# Patient Record
Sex: Male | Born: 1965 | Hispanic: Yes | Marital: Married | Smoking: Never smoker
Health system: Southern US, Community
[De-identification: ages and names within clinical notes are randomized; demographics above are authoritative.]

## PROBLEM LIST (undated history)

## (undated) DIAGNOSIS — E785 Hyperlipidemia, unspecified: Secondary | ICD-10-CM

## (undated) DIAGNOSIS — I10 Essential (primary) hypertension: Secondary | ICD-10-CM

## (undated) HISTORY — DX: Hyperlipidemia, unspecified: E78.5

## (undated) HISTORY — PX: CATARACT EXTRACTION: SUR2

## (undated) HISTORY — DX: Essential (primary) hypertension: I10

---

## 2003-11-10 ENCOUNTER — Emergency Department: Payer: Self-pay | Admitting: Internal Medicine

## 2003-11-10 ENCOUNTER — Other Ambulatory Visit: Payer: Self-pay

## 2004-04-16 ENCOUNTER — Inpatient Hospital Stay: Payer: Self-pay | Admitting: Anesthesiology

## 2007-08-29 ENCOUNTER — Ambulatory Visit: Payer: Self-pay | Admitting: Family Medicine

## 2008-09-10 ENCOUNTER — Emergency Department: Payer: Self-pay | Admitting: Emergency Medicine

## 2010-11-04 ENCOUNTER — Emergency Department: Payer: Self-pay | Admitting: Emergency Medicine

## 2011-05-18 DIAGNOSIS — J351 Hypertrophy of tonsils: Secondary | ICD-10-CM | POA: Insufficient documentation

## 2011-05-18 DIAGNOSIS — J342 Deviated nasal septum: Secondary | ICD-10-CM | POA: Insufficient documentation

## 2011-05-18 DIAGNOSIS — J399 Disease of upper respiratory tract, unspecified: Secondary | ICD-10-CM | POA: Insufficient documentation

## 2011-05-18 DIAGNOSIS — G4733 Obstructive sleep apnea (adult) (pediatric): Secondary | ICD-10-CM | POA: Insufficient documentation

## 2011-06-17 DIAGNOSIS — J301 Allergic rhinitis due to pollen: Secondary | ICD-10-CM | POA: Insufficient documentation

## 2011-11-02 DIAGNOSIS — K603 Anal fistula: Secondary | ICD-10-CM | POA: Insufficient documentation

## 2012-09-28 ENCOUNTER — Emergency Department: Payer: Self-pay | Admitting: Unknown Physician Specialty

## 2012-09-28 LAB — COMPREHENSIVE METABOLIC PANEL
Albumin: 4.4 g/dL (ref 3.4–5.0)
Calcium, Total: 9 mg/dL (ref 8.5–10.1)
Chloride: 107 mmol/L (ref 98–107)
Co2: 24 mmol/L (ref 21–32)
Creatinine: 0.77 mg/dL (ref 0.60–1.30)
EGFR (Non-African Amer.): >60
Glucose: 92 mg/dL (ref 65–99)
Osmolality: 272 (ref 275–301)
Potassium: 3.3 mmol/L — ABNORMAL LOW (ref 3.5–5.1)
SGOT(AST): 25 U/L (ref 15–37)

## 2012-09-28 LAB — CBC
HGB: 15.5 g/dL (ref 13.0–18.0)
MCH: 31.7 pg (ref 26.0–34.0)
MCV: 90 fL (ref 80–100)
Platelet: 264 10*3/uL (ref 150–440)
RBC: 4.91 10*6/uL (ref 4.40–5.90)
RDW: 13.4 % (ref 11.5–14.5)
WBC: 10.6 10*3/uL (ref 3.8–10.6)

## 2012-09-28 LAB — LIPASE, BLOOD: Lipase: 288 U/L (ref 73–393)

## 2013-05-27 ENCOUNTER — Emergency Department: Payer: Self-pay | Admitting: Emergency Medicine

## 2013-05-27 LAB — COMPREHENSIVE METABOLIC PANEL
ANION GAP: 5 — AB (ref 7–16)
Albumin: 4.1 g/dL (ref 3.4–5.0)
Alkaline Phosphatase: 126 U/L — ABNORMAL HIGH
BILIRUBIN TOTAL: 0.3 mg/dL (ref 0.2–1.0)
BUN: 17 mg/dL (ref 7–18)
CREATININE: 0.7 mg/dL (ref 0.60–1.30)
Calcium, Total: 8.7 mg/dL (ref 8.5–10.1)
Chloride: 109 mmol/L — ABNORMAL HIGH (ref 98–107)
Co2: 27 mmol/L (ref 21–32)
EGFR (African American): >60
EGFR (Non-African Amer.): >60
Glucose: 134 mg/dL — ABNORMAL HIGH (ref 65–99)
OSMOLALITY: 285 (ref 275–301)
POTASSIUM: 4.1 mmol/L (ref 3.5–5.1)
SGOT(AST): 45 U/L — ABNORMAL HIGH (ref 15–37)
SGPT (ALT): 53 U/L (ref 12–78)
SODIUM: 141 mmol/L (ref 136–145)
Total Protein: 8.7 g/dL — ABNORMAL HIGH (ref 6.4–8.2)

## 2013-05-27 LAB — URINALYSIS, COMPLETE
Bacteria: NONE SEEN
Bilirubin,UR: NEGATIVE
GLUCOSE, UR: NEGATIVE mg/dL (ref 0–75)
Ketone: NEGATIVE
Leukocyte Esterase: NEGATIVE
Nitrite: NEGATIVE
Ph: 6 (ref 4.5–8.0)
Protein: NEGATIVE
SQUAMOUS EPITHELIAL: NONE SEEN
Specific Gravity: 1.008 (ref 1.003–1.030)
WBC UR: <1 /HPF (ref 0–5)

## 2013-05-27 LAB — CBC
HCT: 44.9 % (ref 40.0–52.0)
HGB: 15.5 g/dL (ref 13.0–18.0)
MCH: 32.2 pg (ref 26.0–34.0)
MCHC: 34.5 g/dL (ref 32.0–36.0)
MCV: 93 fL (ref 80–100)
Platelet: 254 10*3/uL (ref 150–440)
RBC: 4.82 10*6/uL (ref 4.40–5.90)
RDW: 13.2 % (ref 11.5–14.5)
WBC: 11.5 10*3/uL — ABNORMAL HIGH (ref 3.8–10.6)

## 2013-10-31 ENCOUNTER — Emergency Department: Payer: Self-pay | Admitting: Emergency Medicine

## 2013-10-31 LAB — CBC
HCT: 44.8 % (ref 40.0–52.0)
HGB: 14.7 g/dL (ref 13.0–18.0)
MCH: 30.4 pg (ref 26.0–34.0)
MCHC: 32.8 g/dL (ref 32.0–36.0)
MCV: 93 fL (ref 80–100)
PLATELETS: 279 10*3/uL (ref 150–440)
RBC: 4.84 10*6/uL (ref 4.40–5.90)
RDW: 13.4 % (ref 11.5–14.5)
WBC: 11.5 10*3/uL — ABNORMAL HIGH (ref 3.8–10.6)

## 2013-10-31 LAB — COMPREHENSIVE METABOLIC PANEL
ALBUMIN: 3.9 g/dL (ref 3.4–5.0)
ALT: 40 U/L
ANION GAP: 5 — AB (ref 7–16)
Alkaline Phosphatase: 100 U/L
BILIRUBIN TOTAL: 0.4 mg/dL (ref 0.2–1.0)
BUN: 9 mg/dL (ref 7–18)
Calcium, Total: 8.7 mg/dL (ref 8.5–10.1)
Chloride: 109 mmol/L — ABNORMAL HIGH (ref 98–107)
Co2: 27 mmol/L (ref 21–32)
Creatinine: 0.83 mg/dL (ref 0.60–1.30)
EGFR (African American): >60
EGFR (Non-African Amer.): >60
Glucose: 108 mg/dL — ABNORMAL HIGH (ref 65–99)
Osmolality: 280 (ref 275–301)
Potassium: 3.9 mmol/L (ref 3.5–5.1)
SGOT(AST): 30 U/L (ref 15–37)
Sodium: 141 mmol/L (ref 136–145)
Total Protein: 8.5 g/dL — ABNORMAL HIGH (ref 6.4–8.2)

## 2013-10-31 LAB — URINALYSIS, COMPLETE
Bacteria: NONE SEEN
Bilirubin,UR: NEGATIVE
GLUCOSE, UR: NEGATIVE mg/dL (ref 0–75)
KETONE: NEGATIVE
LEUKOCYTE ESTERASE: NEGATIVE
Nitrite: NEGATIVE
PROTEIN: NEGATIVE
Ph: 6 (ref 4.5–8.0)
SPECIFIC GRAVITY: 1.002 (ref 1.003–1.030)
Squamous Epithelial: NONE SEEN
WBC UR: NONE SEEN /HPF (ref 0–5)

## 2013-10-31 LAB — TROPONIN I: Troponin-I: <0.02 ng/mL

## 2013-10-31 LAB — LIPASE, BLOOD: Lipase: 148 U/L (ref 73–393)

## 2013-11-04 ENCOUNTER — Emergency Department: Payer: Self-pay | Admitting: Emergency Medicine

## 2013-11-04 LAB — COMPREHENSIVE METABOLIC PANEL
ALK PHOS: 115 U/L
ALT: 52 U/L
AST: 32 U/L (ref 15–37)
Albumin: 4.1 g/dL (ref 3.4–5.0)
Anion Gap: 7 (ref 7–16)
BILIRUBIN TOTAL: 0.3 mg/dL (ref 0.2–1.0)
BUN: 12 mg/dL (ref 7–18)
CO2: 29 mmol/L (ref 21–32)
Calcium, Total: 9.1 mg/dL (ref 8.5–10.1)
Chloride: 104 mmol/L (ref 98–107)
Creatinine: 0.83 mg/dL (ref 0.60–1.30)
Glucose: 103 mg/dL — ABNORMAL HIGH (ref 65–99)
Osmolality: 279 (ref 275–301)
POTASSIUM: 4.5 mmol/L (ref 3.5–5.1)
SODIUM: 140 mmol/L (ref 136–145)
Total Protein: 9 g/dL — ABNORMAL HIGH (ref 6.4–8.2)

## 2013-11-04 LAB — CBC WITH DIFFERENTIAL/PLATELET
BASOS ABS: 0.1 10*3/uL (ref 0.0–0.1)
Basophil %: 0.5 %
EOS ABS: 0.4 10*3/uL (ref 0.0–0.7)
Eosinophil %: 3.4 %
HCT: 46.5 % (ref 40.0–52.0)
HGB: 15.4 g/dL (ref 13.0–18.0)
LYMPHS PCT: 33.7 %
Lymphocyte #: 4 10*3/uL — ABNORMAL HIGH (ref 1.0–3.6)
MCH: 30.7 pg (ref 26.0–34.0)
MCHC: 33.1 g/dL (ref 32.0–36.0)
MCV: 93 fL (ref 80–100)
MONO ABS: 1.1 x10 3/mm — AB (ref 0.2–1.0)
Monocyte %: 9 %
NEUTROS PCT: 53.4 %
Neutrophil #: 6.3 10*3/uL (ref 1.4–6.5)
Platelet: 299 10*3/uL (ref 150–440)
RBC: 5.01 10*6/uL (ref 4.40–5.90)
RDW: 13.2 % (ref 11.5–14.5)
WBC: 11.8 10*3/uL — ABNORMAL HIGH (ref 3.8–10.6)

## 2013-11-04 LAB — TROPONIN I: Troponin-I: <0.02 ng/mL

## 2013-11-04 LAB — LIPASE, BLOOD: LIPASE: 156 U/L (ref 73–393)

## 2013-11-06 DIAGNOSIS — G4733 Obstructive sleep apnea (adult) (pediatric): Secondary | ICD-10-CM | POA: Insufficient documentation

## 2014-11-23 ENCOUNTER — Emergency Department: Payer: Self-pay

## 2014-11-23 ENCOUNTER — Emergency Department
Admission: EM | Admit: 2014-11-23 | Discharge: 2014-11-24 | Disposition: A | Discharge status: Home / Self Care | Payer: Self-pay | Attending: Emergency Medicine | Admitting: Emergency Medicine

## 2014-11-23 DIAGNOSIS — R51 Headache: Secondary | ICD-10-CM | POA: Insufficient documentation

## 2014-11-23 DIAGNOSIS — R110 Nausea: Secondary | ICD-10-CM | POA: Insufficient documentation

## 2014-11-23 DIAGNOSIS — R519 Headache, unspecified: Secondary | ICD-10-CM

## 2014-11-23 MED ORDER — PROCHLORPERAZINE EDISYLATE 5 MG/ML IJ SOLN
10.0000 mg | Freq: Once | INTRAMUSCULAR | Status: AC
  Administered 2014-11-23: 10 mg via INTRAVENOUS
  Filled 2014-11-23: qty 2

## 2014-11-23 MED ORDER — SODIUM CHLORIDE 0.9 % IV BOLUS (SEPSIS)
1000.0000 mL | Freq: Once | INTRAVENOUS | Status: AC
  Administered 2014-11-23: 1000 mL via INTRAVENOUS

## 2014-11-23 MED ORDER — METOPROLOL TARTRATE 50 MG PO TABS
ORAL_TABLET | ORAL | Status: AC
  Administered 2014-11-23: 50 mg via ORAL
  Filled 2014-11-23: qty 1

## 2014-11-23 MED ORDER — METOPROLOL TARTRATE 50 MG PO TABS
50.0000 mg | ORAL_TABLET | Freq: Once | ORAL | Status: AC
  Administered 2014-11-23: 50 mg via ORAL

## 2014-11-23 NOTE — ED Provider Notes (Signed)
[facility] Emergency Department Provider Note  ____________________________________________  Time seen: Approximately 9:25 PM  I have reviewed the triage vital signs and the nursing notes.   HISTORY  Chief Complaint Headache  Spanish interpreter used for interpretation.  HPI Thomas Gaines is a 49 y.o. male with a history of sleep apnea, hyperlipidemia and high blood pressure was presenting today with 1 week of headache. He said it started in the posterior of his head and is now progressed to the entire head. He describes it as unbearable. He says that the pain has been worse in the mornings and then typically improves throughout the day. He denies any known cause for his headaches. Denies any recent stresses. Says he has had some difficulty going to sleep at night because he thinks his CPAP is blowing oxygen too hard. However, has been sleeping well otherwise. Denies any increased caffeine intake. Does not a history of headaches. Denies any vision changes, dizziness. Has some mild nausea. Does not report any neck pain. Denies any focal weakness or numbness. Was born in [place] but immig[place] to [place] in [place].   No past medical history on file.  There are no active problems to display for this patient.   No past surgical history on file.  No current outpatient prescriptions on file.  Allergies Review of patient's allergies indicates no known allergies.  No family history on file.  Social History Social History  Substance Use Topics  . Smoking status: Not on file  . Smokeless tobacco: Not on file  . Alcohol Use: Not on file    Review of Systems Constitutional: No fever/chills Eyes: No visual changes. ENT: No sore throat. Cardiovascular: Denies chest pain. Respiratory: Denies shortness of breath. Gastrointestinal: No abdominal pain.  No nausea, no vomiting.  No diarrhea.  No constipation. Genitourinary: Negative for  dysuria. Musculoskeletal: Negative for back pain. Skin: Negative for rash. Neurological: Negative for focal weakness or numbness.  10-point ROS otherwise negative.  ____________________________________________   PHYSICAL EXAM:  VITAL SIGNS: ED Triage Vitals  Enc Vitals Group     BP 11/23/14 2049 164/91 mmHg     Pulse Rate 11/23/14 2049 106     Resp 11/23/14 2049 18     Temp 11/23/14 2049 98.7 F (37.1 C)     Temp Source 11/23/14 2049 Oral     SpO2 11/23/14 2049 100 %     Weight 11/23/14 2049 200 lb (90.719 kg)     Height 11/23/14 2049 5\' 9"  (1.753 m)     Head Cir --      Peak Flow --      Pain Score 11/23/14 2050 8     Pain Loc --      Pain Edu? --      Excl. in GC? --     Constitutional: Alert and oriented. Well appearing and in no acute distress. Eyes: Conjunctivae are normal. PERRL. EOMI. Head: Atraumatic. No tenderness palpation along the distribution of the temporal arteries. Nose: No congestion/rhinnorhea. Mouth/Throat: Mucous membranes are moist.  Oropharynx non-erythematous. Neck: No stridor.   Cardiovascular: Normal rate, regular rhythm. Grossly normal heart sounds.  Good peripheral circulation. Respiratory: Normal respiratory effort.  No retractions. Lungs CTAB. Gastrointestinal: Soft and nontender. No distention. No abdominal bruits. No CVA tenderness. Musculoskeletal: No lower extremity tenderness nor edema.  No joint effusions. Neurologic:  Normal speech and language. No gross focal neurologic deficits are appreciated. No gait instability. Skin:  Skin is warm, dry and intact.  No rash noted. Psychiatric: Mood and affect are normal. Speech and behavior are normal.  ____________________________________________   LABS (all labs ordered are listed, but only abnormal results are displayed)  Labs Reviewed - No data to display ____________________________________________  EKG   ____________________________________________  RADIOLOGY  CAT scan of the  brain without any acute pathology. ____________________________________________   PROCEDURES    ____________________________________________   INITIAL IMPRESSION / ASSESSMENT AND PLAN / ED COURSE  Pertinent labs & imaging results that were available during my care of the patient were reviewed by me and considered in my medical decision making (see chart for details).  ----------------------------------------- 12:33 AM on 11/24/2014 -----------------------------------------  Patient now is 0 out of 10 pain. Tachycardic but given evening dose of beta blocker now heart rate is 92 bpm. Patient with migrainous and tension type qualities to the headache. We'll discharge with prescription for fears at. Patient to follow up with primary care doctor. Patient understands plan and is willing to comply. History and physical not consistent with stroke or subarachnoid hemorrhage. The headache was slow onset over the past week. Also without any neurologic deficits. ____________________________________________   FINAL CLINICAL IMPRESSION(S) / ED DIAGNOSES  Headache, resolved.    Myrna Blazer, MD 11/24/14 [phone]

## 2014-11-23 NOTE — ED Notes (Signed)
MD at bedside. 

## 2014-11-23 NOTE — ED Notes (Signed)
Headache for a week, has gotten worse over the past couple days and now unable to tolerate due to the pain, most of pain in the posterior portion of pt's head. Some nausea. Pt states no headache like this in the past. Pt denies blurred vision of dizziness, denies weakness, no cough cold or fever

## 2014-11-24 MED ORDER — BUTALBITAL-APAP-CAFFEINE 50-325-40 MG PO TABS: 1.0000 | ORAL_TABLET | Freq: Four times a day (QID) | ORAL | Status: AC | PRN

## 2014-11-24 MED ORDER — SODIUM CHLORIDE 0.9 % IV BOLUS (SEPSIS)
1000.0000 mL | Freq: Once | INTRAVENOUS | Status: DC
Start: 2014-11-24 — End: 2014-11-24

## 2014-11-24 NOTE — Discharge Instructions (Signed)
Dolor de cabeza general sin causa °(General Headache Without Cause) °El dolor de cabeza es un dolor o malestar que se siente en la zona de la cabeza o del cuello. Puede no tener una causa específica. Hay muchas causas y tipos de dolores de cabeza. Los dolores de cabeza más comunes son los siguientes: °· Cefalea tensional. °· Cefaleas migrañosas. °· Cefalea en brotes. °· Cefaleas diarias crónicas. °INSTRUCCIONES PARA EL CUIDADO EN EL HOGAR  °Controle su afección para ver si hay cambios. Siga estos pasos para controlar la afección: °Control del dolor °· Tome los medicamentos de venta libre y los recetados solamente como se lo haya indicado el médico. °· Cuando sienta dolor de cabeza acuéstese en un cuarto oscuro y tranquilo. °· Si se lo indican, aplique hielo sobre la cabeza y la zona del cuello: °¨ Ponga el hielo en una bolsa plástica. °¨ Coloque una toalla entre la piel y la bolsa de hielo. °¨ Coloque el hielo durante 20 minutos, 2 a 3 veces por día. °· Utilice una almohadilla térmica o tome una ducha con agua caliente para aplicar calor en la cabeza y la zona del cuello como se lo haya indicado el médico. °· Mantenga las luces tenues si le molesta las luces brillantes o sus dolores de cabeza empeoran. °Comida y bebida °· Mantenga un horario para las comidas. °· Limite el consumo de bebidas alcohólicas. °· Consuma menos cantidad de cafeína o deje de tomarla. °Instrucciones generales °· Concurra a todas las visitas de control como se lo haya indicado el médico. Esto es importante. °· Lleve un diario de los dolores de cabeza para averiguar qué factores pueden desencadenarlos. Por ejemplo, escriba los siguientes datos: °¨ Lo que usted come y bebe. °¨ Cuánto tiempo duerme. °¨ Algún cambio en su dieta o en los medicamentos. °· Pruebe algunas técnicas de relajación, como los masajes. °· Limite el estrés. °· Siéntese con la espalda recta y no tense los músculos. °· No consuma productos que contengan tabaco, incluidos  cigarrillos, tabaco de mascar o cigarrillos electrónicos. Si necesita ayuda para dejar de fumar, consulte al médico. °· Haga actividad física habitualmente como se lo haya indicado el médico. °· Tenga un horario fijo para dormir. Duerma entre 7 y 9 horas o la cantidad de horas que le haya recomendado el médico. °SOLICITE ATENCIÓN MÉDICA SI:  °· Los medicamentos no logran aliviar los síntomas. °· Tiene un dolor de cabeza que es diferente del dolor de cabeza habitual. °· Tiene náuseas o vómitos. °· Tiene fiebre. °SOLICITE ATENCIÓN MÉDICA DE INMEDIATO SI:  °· El dolor se hace cada vez más intenso. °· Ha vomitado repetidas veces. °· Presenta rigidez en el cuello. °· Sufre pérdida de la visión. °· Tiene problemas para hablar. °· Siente dolor en el ojo o en el oído. °· Presenta debilidad muscular o pérdida del control muscular. °· Pierde el equilibrio o tiene problemas para caminar. °· Sufre mareos o se desmaya. °· Se siente confundido. °  °Esta información no tiene como fin reemplazar el consejo del médico. Asegúrese de hacerle al médico cualquier pregunta que tenga. °  °Document Released: 11/03/2004 Document Revised: 10/15/2014 °Elsevier Interactive Patient Education ©2016 Elsevier Inc. ° °

## 2015-04-01 DIAGNOSIS — Z9989 Dependence on other enabling machines and devices: Secondary | ICD-10-CM

## 2015-04-01 DIAGNOSIS — G479 Sleep disorder, unspecified: Secondary | ICD-10-CM | POA: Insufficient documentation

## 2015-04-01 DIAGNOSIS — G2581 Restless legs syndrome: Secondary | ICD-10-CM | POA: Insufficient documentation

## 2015-04-01 DIAGNOSIS — G4733 Obstructive sleep apnea (adult) (pediatric): Secondary | ICD-10-CM | POA: Insufficient documentation

## 2015-10-22 ENCOUNTER — Ambulatory Visit: Payer: BLUE CROSS/BLUE SHIELD | Attending: Neurology

## 2015-10-22 DIAGNOSIS — G4733 Obstructive sleep apnea (adult) (pediatric): Secondary | ICD-10-CM | POA: Insufficient documentation

## 2015-10-23 DIAGNOSIS — I10 Essential (primary) hypertension: Secondary | ICD-10-CM | POA: Insufficient documentation

## 2015-10-23 DIAGNOSIS — R0602 Shortness of breath: Secondary | ICD-10-CM | POA: Insufficient documentation

## 2015-10-23 DIAGNOSIS — I4510 Unspecified right bundle-branch block: Secondary | ICD-10-CM | POA: Insufficient documentation

## 2015-10-23 DIAGNOSIS — R002 Palpitations: Secondary | ICD-10-CM | POA: Insufficient documentation

## 2016-01-22 ENCOUNTER — Encounter: Payer: Self-pay | Admitting: Urology

## 2016-01-22 ENCOUNTER — Ambulatory Visit: Payer: BLUE CROSS/BLUE SHIELD | Admitting: Urology

## 2016-01-22 VITALS — BP 110/71 | HR 65 | Ht 69.0 in | Wt 193.6 lb

## 2016-01-22 DIAGNOSIS — F329 Major depressive disorder, single episode, unspecified: Secondary | ICD-10-CM | POA: Insufficient documentation

## 2016-01-22 DIAGNOSIS — F32A Depression, unspecified: Secondary | ICD-10-CM | POA: Insufficient documentation

## 2016-01-22 DIAGNOSIS — K219 Gastro-esophageal reflux disease without esophagitis: Secondary | ICD-10-CM | POA: Insufficient documentation

## 2016-01-22 DIAGNOSIS — F419 Anxiety disorder, unspecified: Secondary | ICD-10-CM | POA: Insufficient documentation

## 2016-01-22 DIAGNOSIS — J309 Allergic rhinitis, unspecified: Secondary | ICD-10-CM | POA: Insufficient documentation

## 2016-01-22 DIAGNOSIS — N50812 Left testicular pain: Secondary | ICD-10-CM | POA: Diagnosis not present

## 2016-01-22 DIAGNOSIS — I10 Essential (primary) hypertension: Secondary | ICD-10-CM | POA: Insufficient documentation

## 2016-01-22 DIAGNOSIS — J351 Hypertrophy of tonsils: Secondary | ICD-10-CM | POA: Insufficient documentation

## 2016-01-22 DIAGNOSIS — E785 Hyperlipidemia, unspecified: Secondary | ICD-10-CM | POA: Insufficient documentation

## 2016-01-22 LAB — URINALYSIS, COMPLETE
Bilirubin, UA: NEGATIVE
GLUCOSE, UA: NEGATIVE
KETONES UA: NEGATIVE
Leukocytes, UA: NEGATIVE
NITRITE UA: NEGATIVE
Protein, UA: NEGATIVE
RBC, UA: NEGATIVE
SPEC GRAV UA: 1.015 (ref 1.005–1.030)
UUROB: 0.2 mg/dL (ref 0.2–1.0)
pH, UA: 5.5 (ref 5.0–7.5)

## 2016-01-22 LAB — MICROSCOPIC EXAMINATION: Bacteria, UA: NONE SEEN

## 2016-01-22 NOTE — Progress Notes (Signed)
01/22/2016 10:55 AM   Peterson LombardSergio Dirzo Hernandez 31-May-1965 [id number]  Referring provider: Phineas Realharles Drew [facility] [address] [place], [place] [address]  Chief Complaint  Patient presents with  . New Patient (Initial Visit)    Left testicle pain    HPI: The patient is a 50 year old gentleman who presents today for evaluation of left testicular pain. He has been treated with Bactrim with no relief. The pain is mainly located in his left testicle but also radiates to his penis.  His main concern though is the pain in his left testicle. It is a dull squeezing pain. Has been present for 2 weeks. Do not have this pain prior. He has mild dysuria with urination as well. He has been on Bactrim which has not helped this persisted. His urine cultures as well as STD screening were negative. He has never had this pain before. He denies trauma to his groin. He has no issues of urinary tract infections. His urinalysis today is unremarkable.   PMH: Past Medical History:  Diagnosis Date  . Hyperlipidemia   . Hypertension     Surgical History: Past Surgical History:  Procedure Laterality Date  . CATARACT EXTRACTION      Home Medications:  Allergies as of 01/22/2016   No Known Allergies     Medication List       Accurate as of 01/22/16 10:55 AM. Always use your most recent med list.          gabapentin 300 MG capsule Commonly known as:  NEURONTIN Take by mouth.   lisinopril 10 MG tablet Commonly known as:  PRINIVIL,ZESTRIL Take by mouth.   metoprolol succinate 100 MG 24 hr tablet Commonly known as:  TOPROL-XL Take by mouth.   simvastatin 20 MG tablet Commonly known as:  ZOCOR Take by mouth.   sulfamethoxazole-trimethoprim 800-160 MG tablet Commonly known as:  BACTRIM DS,SEPTRA DS Take 1 tablet by mouth 2 (two) times daily.       Allergies: No Known Allergies  Family History: Family History  Problem Relation Age of Onset  . Prostate  cancer Neg Hx   . Bladder Cancer Neg Hx   . Kidney cancer Neg Hx     Social History:  reports that he has never smoked. He has never used smokeless tobacco. He reports that he uses drugs, including Anabolic steroids. He reports that he does not drink alcohol.  ROS: UROLOGY Frequent Urination?: Yes Hard to postpone urination?: Yes Burning/pain with urination?: Yes Get up at night to urinate?: No Leakage of urine?: No Urine stream starts and stops?: No Trouble starting stream?: Yes Do you have to strain to urinate?: Yes Blood in urine?: Yes Urinary tract infection?: No Sexually transmitted disease?: No Injury to kidneys or bladder?: No Painful intercourse?: No Weak stream?: No Erection problems?: No Penile pain?: Yes  Gastrointestinal Nausea?: No Vomiting?: No Indigestion/heartburn?: No Diarrhea?: Yes Constipation?: No  Constitutional Fever: No Night sweats?: No Weight loss?: No Fatigue?: No  Skin Skin rash/lesions?: No Itching?: No  Eyes Blurred vision?: No Double vision?: No  Ears/Nose/Throat Sore throat?: No Sinus problems?: No  Hematologic/Lymphatic Swollen glands?: No Easy bruising?: No  Cardiovascular Leg swelling?: No Chest pain?: Yes  Respiratory Cough?: No Shortness of breath?: Yes  Endocrine Excessive thirst?: No  Musculoskeletal Back pain?: Yes Joint pain?: Yes  Neurological Headaches?: No Dizziness?: No  Psychologic Depression?: No Anxiety?: No  Physical Exam: BP 110/71 (BP Location: Left Arm, Patient Position: Sitting, Cuff Size: Large)  Pulse 65   Ht 5\' 9"  (1.753 m)   Wt 193 lb 9.6 oz (87.8 kg)   BMI 28.59 kg/m   Constitutional:  Alert and oriented, No acute distress. HEENT:  AT, moist mucus membranes.  Trachea midline, no masses. Cardiovascular: No clubbing, cyanosis, or edema. Respiratory: Normal respiratory effort, no increased work of breathing. GI: Abdomen is soft, nontender, nondistended, no abdominal  masses GU: No CVA tenderness. Normal uncircumcised phallus. Bilateral testicles descended equally bilaterally. Nontender to palpation. No masses. Benign. DRE: 1+ benign. No evidence of prostatitis. Skin: No rashes, bruises or suspicious lesions. Lymph: No cervical or inguinal adenopathy. Neurologic: Grossly intact, no focal deficits, moving all 4 extremities. Psychiatric: Normal mood and affect.  Laboratory Data: Lab Results  Component Value Date   WBC 11.8 (H) 11/04/2013   HGB 15.4 11/04/2013   HCT 46.5 11/04/2013   MCV 93 11/04/2013   PLT 299 11/04/2013    Lab Results  Component Value Date   CREATININE 0.83 11/04/2013    No results found for: PSA  No results found for: TESTOSTERONE  No results found for: HGBA1C  Urinalysis    Component Value Date/Time   COLORURINE Colorless 10/31/2013 0412   APPEARANCEUR Clear 10/31/2013 0412   LABSPEC 1.002 10/31/2013 0412   PHURINE 6.0 10/31/2013 0412   GLUCOSEU Negative 10/31/2013 0412   HGBUR 1+ 10/31/2013 0412   BILIRUBINUR Negative 10/31/2013 0412   KETONESUR Negative 10/31/2013 0412   PROTEINUR Negative 10/31/2013 0412   NITRITE Negative 10/31/2013 0412   LEUKOCYTESUR Negative 10/31/2013 0412     Assessment & Plan:    1. Left testicular pain I discussed with the patient that he has no apparent infectious etiology for his left testicular pain. It is most likely musculoskeletal in nature. I recommended that he take ibuprofen 600 mg 3 times a day and has scheduled basis. He is also instructed to ice his scrotum 2 times per day. He is instructed of this pain may take some time to go away. He'll follow-up in a month if he is still symptomatic.   Return in about 4 weeks (around 02/19/2016).  Hildred LaserBrian James Jerian Morais, MD  [facility] [address], [address] [place], [place] [address] [phone]

## 2016-02-25 ENCOUNTER — Ambulatory Visit: Payer: BLUE CROSS/BLUE SHIELD

## 2016-03-10 ENCOUNTER — Ambulatory Visit: Payer: BLUE CROSS/BLUE SHIELD | Admitting: Urology

## 2016-03-10 ENCOUNTER — Encounter: Payer: Self-pay | Admitting: Urology

## 2016-03-10 VITALS — BP 145/91 | HR 70 | Ht 69.0 in | Wt 192.9 lb

## 2016-03-10 DIAGNOSIS — N50819 Testicular pain, unspecified: Secondary | ICD-10-CM | POA: Diagnosis not present

## 2016-03-10 NOTE — Progress Notes (Signed)
03/10/2016 2:07 PM   Thomas Gaines 05-16-1965 [id number]  Referring provider: Hyman HopesHarriett P Burns, MD [address] [place], [place] [address]  Chief Complaint  Patient presents with  . Testicle Pain    HPI: The patient is a 51 year old gentleman who presents today for evaluation of left testicular pain. He has been treated with Bactrim with no relief. The pain is mainly located in his left testicle but also radiates to his penis.  His main concern though is the pain in his left testicle. It is a dull squeezing pain. Has been present for 2 weeks. Do not have this pain prior. He has mild dysuria with urination as well. He has been on Bactrim which has not helped this persisted. His urine cultures as well as STD screening were negative. He has never had this pain before. He denies trauma to his groin. He has no issues of urinary tract infections. His urinalysis today is unremarkable. Exam was benign at first visit.  At his last visit he was started on ibuprofen and icing of his scrotum.  He did not do this that often. He feels that he has a mass in his left testicle. When he touches that he has pain for 4-5 days. He is concerned by this.    PMH: Past Medical History:  Diagnosis Date  . Hyperlipidemia   . Hyperlipidemia   . Hypertension     Surgical History: Past Surgical History:  Procedure Laterality Date  . CATARACT EXTRACTION      Home Medications:  Allergies as of 03/10/2016   No Known Allergies     Medication List       Accurate as of 03/10/16  2:07 PM. Always use your most recent med list.          gabapentin 300 MG capsule Commonly known as:  NEURONTIN Take by mouth.   lisinopril 10 MG tablet Commonly known as:  PRINIVIL,ZESTRIL Take by mouth.   metoprolol succinate 100 MG 24 hr tablet Commonly known as:  TOPROL-XL Take by mouth.   simvastatin 20 MG tablet Commonly known as:  ZOCOR Take by mouth.   sulfamethoxazole-trimethoprim 800-160 MG  tablet Commonly known as:  BACTRIM DS,SEPTRA DS Take 1 tablet by mouth 2 (two) times daily.       Allergies: No Known Allergies  Family History: Family History  Problem Relation Age of Onset  . Prostate cancer Neg Hx   . Bladder Cancer Neg Hx   . Kidney cancer Neg Hx     Social History:  reports that he has never smoked. He has never used smokeless tobacco. He reports that he uses drugs, including Anabolic steroids. He reports that he does not drink alcohol.  ROS: UROLOGY Frequent Urination?: Yes Hard to postpone urination?: No Burning/pain with urination?: No Get up at night to urinate?: No Leakage of urine?: No Urine stream starts and stops?: No Trouble starting stream?: Yes Do you have to strain to urinate?: Yes Blood in urine?: No Urinary tract infection?: Yes Sexually transmitted disease?: No Injury to kidneys or bladder?: No Painful intercourse?: No Weak stream?: Yes Erection problems?: No Penile pain?: No  Gastrointestinal Nausea?: No Vomiting?: No Indigestion/heartburn?: No Diarrhea?: Yes Constipation?: No  Constitutional Fever: No Night sweats?: No Weight loss?: No Fatigue?: No  Skin Skin rash/lesions?: Yes Itching?: No  Eyes Blurred vision?: No Double vision?: No  Ears/Nose/Throat Sore throat?: No Sinus problems?: No  Hematologic/Lymphatic Swollen glands?: No Easy bruising?: No  Cardiovascular Leg swelling?:  No Chest pain?: No  Respiratory Cough?: No Shortness of breath?: No  Endocrine Excessive thirst?: No  Musculoskeletal Back pain?: No Joint pain?: No  Neurological Headaches?: No Dizziness?: No  Psychologic Depression?: No Anxiety?: No  Physical Exam: BP (!) 145/91 (BP Location: Left Arm, Patient Position: Sitting, Cuff Size: Normal)   Pulse 70   Ht 5\' 9"  (1.753 m)   Wt 192 lb 14.4 oz (87.5 kg)   BMI 28.49 kg/m   Constitutional:  Alert and oriented, No acute distress. HEENT: [place] AT, moist mucus membranes.   Trachea midline, no masses. Cardiovascular: No clubbing, cyanosis, or edema. Respiratory: Normal respiratory effort, no increased work of breathing. GI: Abdomen is soft, nontender, nondistended, no abdominal masses GU: No CVA tenderness. His left testicular exam again is normal to me that he has a fairly tight scrotum so it is somewhat limited. The patient attempted to find a mass that he felt prior however he was unable to do that during this exam. Skin: No rashes, bruises or suspicious lesions. Lymph: No cervical or inguinal adenopathy. Neurologic: Grossly intact, no focal deficits, moving all 4 extremities. Psychiatric: Normal mood and affect.  Laboratory Data: Lab Results  Component Value Date   WBC 11.8 (H) 11/04/2013   HGB 15.4 11/04/2013   HCT 46.5 11/04/2013   MCV 93 11/04/2013   PLT 299 11/04/2013    Lab Results  Component Value Date   CREATININE 0.83 11/04/2013    No results found for: PSA  No results found for: TESTOSTERONE  No results found for: HGBA1C  Urinalysis    Component Value Date/Time   COLORURINE Colorless 10/31/2013 0412   APPEARANCEUR Clear 01/22/2016 1005   LABSPEC 1.002 10/31/2013 0412   PHURINE 6.0 10/31/2013 0412   GLUCOSEU Negative 01/22/2016 1005   GLUCOSEU Negative 10/31/2013 0412   HGBUR 1+ 10/31/2013 0412   BILIRUBINUR Negative 01/22/2016 1005   BILIRUBINUR Negative 10/31/2013 0412   KETONESUR Negative 10/31/2013 0412   PROTEINUR Negative 01/22/2016 1005   PROTEINUR Negative 10/31/2013 0412   NITRITE Negative 01/22/2016 1005   NITRITE Negative 10/31/2013 0412   LEUKOCYTESUR Negative 01/22/2016 1005   LEUKOCYTESUR Negative 10/31/2013 0412     Assessment & Plan:    1. Left testicular pain As the patient feels that there is a mass that I'm unable to palpate his left hemiscrotum, I will have him undergo a scrotal ultrasound. He'll follow-up after this for the results. He was instructed to use ibuprofen and I seem the scrotum in the  meantime for his testicular pain.  Return for after scrotal u/s.  Hildred Laser, MD  [facility] [address], [address] [place], [place] [phone]

## 2016-03-18 ENCOUNTER — Ambulatory Visit
Admission: RE | Admit: 2016-03-18 | Discharge: 2016-03-18 | Disposition: A | Discharge status: Home / Self Care | Payer: BLUE CROSS/BLUE SHIELD | Source: Ambulatory Visit | Attending: Urology | Admitting: Urology

## 2016-03-18 DIAGNOSIS — N50819 Testicular pain, unspecified: Secondary | ICD-10-CM | POA: Diagnosis not present

## 2016-03-23 ENCOUNTER — Other Ambulatory Visit: Payer: Self-pay

## 2016-04-07 ENCOUNTER — Encounter: Payer: Self-pay | Admitting: Urology

## 2016-04-07 ENCOUNTER — Ambulatory Visit: Payer: BLUE CROSS/BLUE SHIELD | Admitting: Urology

## 2016-04-07 VITALS — BP 146/76 | HR 91 | Ht 68.0 in | Wt 192.1 lb

## 2016-04-07 DIAGNOSIS — N50819 Testicular pain, unspecified: Secondary | ICD-10-CM | POA: Diagnosis not present

## 2016-04-07 NOTE — Progress Notes (Signed)
04/07/2016 10:53 AM   Thomas Gaines [birth year] [id number]  Referring provider: Hyman Hopes, MD [address] [place], [place] [address]  Chief Complaint  Patient presents with  . Follow-up    scrotal US    HPI: The patient is a 51 year old gentleman who presents today for evaluation of left testicular pain. He has been treated with Bactrim with no relief. The pain is mainly located in his left testicle but also radiates to his penis. His main concern though is the pain in his left testicle. It is a dull squeezing pain. Has been present for 2 weeks. Do not have this pain prior. He has mild dysuria with urination as well. He has been on Bactrim which has not helped this persisted. His urine cultures as well as STD screening were negative. He has never had this pain before. He denies trauma to his groin. He has no issues of urinary tract infections. His urinalysis today is unremarkable. Exam was benign at first visit.  At his last visit he was started on ibuprofen and icing of his scrotum.  He did not do this that often. He feels that he has a mass in his left testicle. When he touches that he has pain for 4-5 days. He is concerned by this but not able to reproduce or locate on repeat exam.    He returns today to review scrotal ultrasound results which were unremarkable with no masses. He does again note that his strenuous job does seem to exacerbate this pain. Advil has not helped with his discomfort.   PMH: Past Medical History:  Diagnosis Date  . Hyperlipidemia   . Hyperlipidemia   . Hypertension     Surgical History: Past Surgical History:  Procedure Laterality Date  . CATARACT EXTRACTION      Home Medications:  Allergies as of 04/07/2016   No Known Allergies     Medication List       Accurate as of 04/07/16 10:53 AM. Always use your most recent med list.          gabapentin 300 MG capsule Commonly known as:  NEURONTIN Take by mouth.     lisinopril 10 MG tablet Commonly known as:  PRINIVIL,ZESTRIL Take by mouth.   metoprolol succinate 100 MG 24 hr tablet Commonly known as:  TOPROL-XL Take by mouth.   simvastatin 20 MG tablet Commonly known as:  ZOCOR Take by mouth.   sulfamethoxazole-trimethoprim 800-160 MG tablet Commonly known as:  BACTRIM DS,SEPTRA DS Take 1 tablet by mouth 2 (two) times daily.       Allergies: No Known Allergies  Family History: Family History  Problem Relation Age of Onset  . Prostate cancer Neg Hx   . Bladder Cancer Neg Hx   . Kidney cancer Neg Hx     Social History:  reports that he has never smoked. He has never used smokeless tobacco. He reports that he uses drugs, including Anabolic steroids. He reports that he does not drink alcohol.  ROS: UROLOGY Frequent Urination?: No Hard to postpone urination?: No Burning/pain with urination?: No Get up at night to urinate?: No Leakage of urine?: No Urine stream starts and stops?: Yes Trouble starting stream?: No Do you have to strain to urinate?: No Blood in urine?: No Urinary tract infection?: No Sexually transmitted disease?: No Injury to kidneys or bladder?: No Painful intercourse?: No Weak stream?: Yes Erection problems?: No Penile pain?: Yes  Gastrointestinal Nausea?: Yes Vomiting?: No Indigestion/heartburn?: No Diarrhea?:  No Constipation?: No  Constitutional Fever: No Night sweats?: No Weight loss?: No Fatigue?: No  Skin Skin rash/lesions?: No Itching?: No  Eyes Blurred vision?: No Double vision?: No  Ears/Nose/Throat Sore throat?: No Sinus problems?: No  Hematologic/Lymphatic Swollen glands?: No Easy bruising?: No  Cardiovascular Leg swelling?: No Chest pain?: No  Respiratory Cough?: No Shortness of breath?: No  Endocrine Excessive thirst?: No  Musculoskeletal Back pain?: No Joint pain?: No  Neurological Headaches?: Yes Dizziness?: No  Psychologic Depression?: No Anxiety?:  No  Physical Exam: BP (!) 146/76   Pulse 91   Ht 5\' 8"  (1.727 m)   Wt 192 lb 1.6 oz (87.1 kg)   BMI 29.21 kg/m   Constitutional:  Alert and oriented, No acute distress. HEENT: [place] AT, moist mucus membranes.  Trachea midline, no masses. Cardiovascular: No clubbing, cyanosis, or edema. Respiratory: Normal respiratory effort, no increased work of breathing. GI: Abdomen is soft, nontender, nondistended, no abdominal masses GU: No CVA tenderness.  Skin: No rashes, bruises or suspicious lesions. Lymph: No cervical or inguinal adenopathy. Neurologic: Grossly intact, no focal deficits, moving all 4 extremities. Psychiatric: Normal mood and affect.  Laboratory Data: Lab Results  Component Value Date   WBC 11.8 (H) 11/04/2013   HGB 15.4 11/04/2013   HCT 46.5 11/04/2013   MCV 93 11/04/2013   PLT 299 11/04/2013    Lab Results  Component Value Date   CREATININE 0.83 11/04/2013    No results found for: PSA  No results found for: TESTOSTERONE  No results found for: HGBA1C  Urinalysis    Component Value Date/Time   COLORURINE Colorless 10/31/2013 0412   APPEARANCEUR Clear 01/22/2016 1005   LABSPEC 1.002 10/31/2013 0412   PHURINE 6.0 10/31/2013 0412   GLUCOSEU Negative 01/22/2016 1005   GLUCOSEU Negative 10/31/2013 0412   HGBUR 1+ 10/31/2013 0412   BILIRUBINUR Negative 01/22/2016 1005   BILIRUBINUR Negative 10/31/2013 0412   KETONESUR Negative 10/31/2013 0412   PROTEINUR Negative 01/22/2016 1005   PROTEINUR Negative 10/31/2013 0412   NITRITE Negative 01/22/2016 1005   NITRITE Negative 10/31/2013 0412   LEUKOCYTESUR Negative 01/22/2016 1005   LEUKOCYTESUR Negative 10/31/2013 0412    Pertinent Imaging: Negative scrotal ultrasound  Assessment & Plan:    1. Left testicular pain The patient has no obvious pathological reason for his testicular pain. I think it is likely related to his pelvic floor muscles especially since it is exacerbated by his strenuous job. I think  could benefit from pelvic floor physical therapy. We will have him see our pelvic floor physical therapist and follow-up with us in a few months after undergoing therapy.  Return in about 3 months (around 07/08/2016) for after seeing pelvic floor PT.  Hildred LaserBrian James Taylin Leder, MD  [facility] [address], [address] [place], [place] [address] [phone]

## 2016-04-26 ENCOUNTER — Ambulatory Visit: Payer: BLUE CROSS/BLUE SHIELD | Attending: Urology | Admitting: Physical Therapy

## 2016-04-26 DIAGNOSIS — M791 Myalgia, unspecified site: Secondary | ICD-10-CM

## 2016-04-26 DIAGNOSIS — R278 Other lack of coordination: Secondary | ICD-10-CM

## 2016-04-27 NOTE — Therapy (Signed)
[facility] MAIN [facility] SERVICES [address] [place], [place], [address] Phone: [phone]   Fax:  [phone]  Physical Therapy Evaluation  Patient Details  Name: Thomas Gaines MRN: [id number] Date of Birth: [birth year] Referring Provider: Sherryl Barters  Encounter Date: 04/26/2016      PT End of Session - 04/27/16 2144    Visit Number 1   Number of Visits 12   PT Start Time 1330   PT Stop Time 1435   PT Time Calculation (min) 65 min   Activity Tolerance Patient tolerated treatment well;No increased pain   Behavior During Therapy WFL for tasks assessed/performed      Past Medical History:  Diagnosis Date  . Hyperlipidemia   . Hyperlipidemia   . Hypertension     Past Surgical History:  Procedure Laterality Date  . CATARACT EXTRACTION      There were no vitals filed for this visit.       Subjective Assessment - 04/27/16 2120    Subjective Pt reported throbbing pain in the L testicular starting 3 months ago. Pain 5-6/10 in the morning and increases to 8/10 by the end of the day. Currently 3/10.  Pain does not interrupt him at night, nor affect urination, defecation, sexual functions.  Since applying cream, pt reports his pain has improved but he was urged by two of his MDs to consult with pelvic health PT. 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 Pt has noticed sometimes the pain occurs after work.     Currently in Pain? 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 increased adductor use on machine                 Pelvic Floor Special Questions - 04/27/16 2128    Diastasis Recti no separation   External Palpation consented  provided verbally, palpation through clothing  tender/tension bulbospongious R>L,deep transverse perineal L                  PT Education - 04/27/16 2144    Education provided Yes   Education Details POC, anatomy, physiology, goals, HEP   Person(s) Educated Patient  interpreter   Methods Explanation;Demonstration;Tactile cues;Verbal cues;Handout   Comprehension Returned demonstration;Verbalized understanding             PT Long Term Goals - 04/27/16 2150      PT LONG TERM GOAL #1   Title Pt will demo proper body mechanics with work simulated tasks with proper breathing techniques to minmize load on the pelvic floor mm in order to minimzie relapse of pain   Time 12   Period Weeks  Status New     PT LONG TERM GOAL #2   Title Pt will demo decreased pelvic floor mm tensions /tenderness across 2 visits in order to improve pain   Time 12   Period Weeks   Status New     PT LONG TERM GOAL #3   Title Pt will be IND with HEP   Time 12   Period Weeks   Status New               Plan - 04/27/16 2145    Clinical Impression Statement Pt is a 51 yo old male who reports throbbing pain located in the L testicle area that started 3 months ago and would increase by the end of the day. Pain that does not interfere with sleep, urination defecation, nor sexual function. Since applying creams he found on his own, pain has improved but he reported 3/10 pain today while sitting during the interview. Pt 's clinical presentation include increased pelvic floor mm, dyscoordination of pelvic floor mm and other  deep core mm, delayed lengthening of pelvic floor mm,  increased adductor use with simulated work task, and Psychologist, sport and exercisebreahholding/poor body mechanics with exertional activities at work which places loading forces onto the pelvic floor. Following Tx today, pt demo'd decreased mm tensions, improved deep core coordination, and proper body mechanics with work tasks.      Rehab Potential Good   PT Frequency Monthy   PT Duration 12 weeks   PT Treatment/Interventions Therapeutic activities;Moist Heat;Traction;Aquatic Therapy;Therapeutic exercise;Neuromuscular re-education;Balance training;Manual lymph drainage;Patient/family education;Gait training;ADLs/Self Care Home Management;Scar mobilization;Passive range of motion;Energy conservation;Taping   Consulted and Agree with Plan of Care Patient      Patient will benefit from skilled therapeutic intervention in order to improve the following deficits and impairments:  Improper body mechanics, Decreased range of motion, Decreased endurance, Decreased coordination, Decreased safety awareness, Decreased strength, Postural dysfunction, Pain, Decreased mobility, Increased muscle spasms  Visit Diagnosis: Myalgia  Other lack of coordination     Problem List Patient Active Problem List   Diagnosis Date Noted  . Allergic rhinitis 01/22/2016  . Anxiety 01/22/2016  . Depression 01/22/2016  . GERD (gastroesophageal reflux disease) 01/22/2016  . Hyperlipidemia 01/22/2016  . Hypertension 01/22/2016  . Tonsillar hypertrophy 01/22/2016  . Essential hypertension 10/23/2015  . Heart palpitations 10/23/2015  . RBBB 10/23/2015  . SOB (shortness of breath) on exertion 10/23/2015  . OSA on CPAP 04/01/2015  . Restless legs 04/01/2015  . Sleep disorder 04/01/2015  . Iron disorder 11/06/2013  . OSA (obstructive sleep apnea) 11/06/2013  . 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 Allergic rhinitis due to pollen 06/17/2011  . Deviated nasal septum 05/18/2011  . Hypertrophy of tonsils  05/18/2011  . Obstructive sleep apnea 05/18/2011  . Disorder of upper respiratory system 05/18/2011    Mariane MastersYeung,Shin Yiing ,PT, DPT, E-RYT  04/27/2016, 9:55 PM  [facility] MAIN [facility] [address] [place], [place], [address] Phone: [phone]   Fax:  [phone]  Name: Peterson LombardSergio Dirzo Hernandez MRN: [id number] Date of Birth: [birth year]

## 2016-04-27 NOTE — Patient Instructions (Signed)
Hand out with drawings provided to pt  _breathing with expansion at ribcage to lengthen pelvic floor  10 x 2  _standing -lunge position with exhale with pulling at work task  _butterfly stretch on days when used machine to decrease adductors overuse _adductor stretch at work, or seated piriformis stretch perform when sitting at lunch, or putting on shoes

## 2016-05-02 ENCOUNTER — Ambulatory Visit: Payer: BLUE CROSS/BLUE SHIELD | Admitting: Physical Therapy

## 2016-05-04 ENCOUNTER — Ambulatory Visit: Payer: BLUE CROSS/BLUE SHIELD | Admitting: Physical Therapy

## 2016-05-08 ENCOUNTER — Encounter: Payer: Self-pay | Admitting: Emergency Medicine

## 2016-05-08 DIAGNOSIS — I10 Essential (primary) hypertension: Secondary | ICD-10-CM | POA: Insufficient documentation

## 2016-05-08 DIAGNOSIS — R42 Dizziness and giddiness: Secondary | ICD-10-CM | POA: Diagnosis present

## 2016-05-08 DIAGNOSIS — Z79899 Other long term (current) drug therapy: Secondary | ICD-10-CM | POA: Diagnosis not present

## 2016-05-08 NOTE — ED Triage Notes (Signed)
Patient states that his blood pressure has been elevated for a couple of weeks. Patient states that his systolic has been 200. Patient states that he has a history of hypertension and has been taking his medication. Patient states that he has seen his PCP and was told that his blood pressure was fine.

## 2016-05-09 ENCOUNTER — Emergency Department
Admission: EM | Admit: 2016-05-09 | Discharge: 2016-05-09 | Disposition: A | Discharge status: Home / Self Care | Payer: BLUE CROSS/BLUE SHIELD | Attending: Emergency Medicine | Admitting: Emergency Medicine

## 2016-05-09 DIAGNOSIS — I10 Essential (primary) hypertension: Secondary | ICD-10-CM

## 2016-05-09 NOTE — ED Notes (Signed)
Pt. States high bp for the past couple weeks.

## 2016-05-09 NOTE — ED Notes (Signed)
Pt. Going home with wife. 

## 2016-05-09 NOTE — ED Notes (Signed)
Pt. States he has been on the same blood pressure medication (lisinipril) for the past 12 years.  Pt. States no new medication started.  Pt. States he has BP machine at home and it read 200/100+.  Pt. Alert and oriented in room.  Pt. States slight HA.

## 2016-05-09 NOTE — ED Notes (Signed)
Pt. States slight center chest pain that radiates to the back.

## 2016-05-09 NOTE — ED Provider Notes (Signed)
[facility] Emergency Department Provider Note  ___________________________________   I have reviewed the triage vital signs and the nursing notes.   HISTORY  Chief Complaint Hypertension   History limited by: Language [employer] [facility] Interpreter utilized   HPI Thomas Gaines is a 51 y.o. male who presents to the emergency department today because of concerns for high blood pressure. Patient has a history of high blood pressure and states he is on medication for. For the past 3 weeks though his blood pressures have been higher than normal. Comes in today because he felt like his blood pressure was higher tonight. Had the sensation of a fast heart rate and sensation of his heart rate in his chest. Additionally he has felt slightly dizzy. The patient denies any headache. No chest pain.   Past Medical History:  Diagnosis Date  . Hyperlipidemia   . Hyperlipidemia   . Hypertension     Patient Active Problem List   Diagnosis Date Noted  . Allergic rhinitis 01/22/2016  . Anxiety 01/22/2016  . Depression 01/22/2016  . GERD (gastroesophageal reflux disease) 01/22/2016  . Hyperlipidemia 01/22/2016  . Hypertension 01/22/2016  . Tonsillar hypertrophy 01/22/2016  . Essential hypertension 10/23/2015  . Heart palpitations 10/23/2015  . RBBB 10/23/2015  . SOB (shortness of breath) on exertion 10/23/2015  . OSA on CPAP 04/01/2015  . Restless legs 04/01/2015  . Sleep disorder 04/01/2015  . Iron disorder 11/06/2013  . OSA (obstructive sleep apnea) 11/06/2013  . 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 Allergic rhinitis due to pollen 06/17/2011  . Deviated nasal septum 05/18/2011  . Hypertrophy of tonsils 05/18/2011  . Obstructive sleep apnea 05/18/2011  . Disorder of upper respiratory system 05/18/2011    Past Surgical History:  Procedure Laterality Date  . CATARACT EXTRACTION      Prior to Admission medications   Medication Sig Start Date End Date Taking?  Authorizing Provider  gabapentin (NEURONTIN) 300 MG capsule Take by mouth. 03/25/15   Historical Provider, MD  lisinopril (PRINIVIL,ZESTRIL) 10 MG tablet Take by mouth. 11/19/15   Historical Provider, MD  metoprolol succinate (TOPROL-XL) 100 MG 24 hr tablet Take by mouth. 03/04/15   Historical Provider, MD  simvastatin (ZOCOR) 20 MG tablet Take by mouth. 03/26/15   Historical Provider, MD  sulfamethoxazole-trimethoprim (BACTRIM DS,SEPTRA DS) 800-160 MG tablet Take 1 tablet by mouth 2 (two) times daily.    Historical Provider, MD    Allergies Patient has no known allergies.  Family History  Problem Relation Age of Onset  . Prostate cancer Neg Hx   . Bladder Cancer Neg Hx   . Kidney cancer Neg Hx     Social History Social History  Substance Use Topics  . Smoking status: Never Smoker  . Smokeless tobacco: Never Used  . Alcohol use No    Review of Systems  Constitutional: Negative for fever. Cardiovascular: Negative for chest pain. Respiratory: Negative for shortness of breath. Gastrointestinal: Negative for abdominal pain, vomiting and diarrhea. Neurological: Negative for headaches, focal weakness or numbness.  10-point ROS otherwise negative.  ____________________________________________   PHYSICAL EXAM:  VITAL SIGNS: ED Triage Vitals  Enc Vitals Group     BP 05/08/16 2307 (!) 185/87     Pulse Rate 05/08/16 2307 88     Resp 05/08/16 2307 18     Temp 05/08/16 2307 98.9 F (37.2 C)     Temp Source 05/08/16 2307 Oral     SpO2 05/08/16 2307 99 %  Weight 05/08/16 2307 192 lb (87.1 kg)     Height --      Head Circumference --      Peak Flow --      Pain Score 05/08/16 2333 0   Constitutional: Alert and oriented. Well appearing and in no distress. Eyes: Conjunctivae are normal. Normal extraocular movements. ENT   Head: Normocephalic and atraumatic.   Nose: No congestion/rhinnorhea.   Mouth/Throat: Mucous membranes are moist.   Neck: No  stridor. Cardiovascular: Normal rate, regular rhythm.  No murmurs, rubs, or gallops. Respiratory: Normal respiratory effort without tachypnea nor retractions. Breath sounds are clear and equal bilaterally. No wheezes/rales/rhonchi. Gastrointestinal: Soft and non tender. No rebound. No guarding.  Genitourinary: Deferred Musculoskeletal: Normal range of motion in all extremities.  Neurologic:  Normal speech and language. No gross focal neurologic deficits are appreciated.  Skin:  Skin is warm, dry and intact. No rash noted. Psychiatric: Mood and affect are normal. Speech and behavior are normal. Patient exhibits appropriate insight and judgment.  ____________________________________________    LABS (pertinent positives/negatives)  None  ____________________________________________   EKG  None  ____________________________________________    RADIOLOGY  None   ____________________________________________   PROCEDURES  Procedures  ____________________________________________   INITIAL IMPRESSION / ASSESSMENT AND PLAN / ED COURSE  Pertinent labs & imaging results that were available during my care of the patient were reviewed by me and considered in my medical decision making (see chart for details).  Patient presented to the emergency department today because of concerns for greater than normal high blood pressure. Patient appeared well. Blood pressure did improve here in the emergency department without any treatment. I had a discussion with patient about importance of following up with primary care.  ____________________________________________   FINAL CLINICAL IMPRESSION(S) / ED DIAGNOSES  Final diagnoses:  Hypertension, unspecified type     Note: This dictation was prepared with Dragon dictation. Any transcriptional errors that result from this process are unintentional     Phineas Semen, MD 05/09/16 [phone]

## 2016-05-09 NOTE — Discharge Instructions (Signed)
Please seek medical attention for any high fevers, chest pain, shortness of breath, change in behavior, persistent vomiting, bloody stool or any other new or concerning symptoms.  

## 2016-05-11 ENCOUNTER — Encounter: Payer: Self-pay | Admitting: Emergency Medicine

## 2016-05-11 ENCOUNTER — Emergency Department: Payer: BLUE CROSS/BLUE SHIELD

## 2016-05-11 ENCOUNTER — Emergency Department
Admission: EM | Admit: 2016-05-11 | Discharge: 2016-05-11 | Disposition: A | Discharge status: Home / Self Care | Payer: BLUE CROSS/BLUE SHIELD | Attending: Emergency Medicine | Admitting: Emergency Medicine

## 2016-05-11 DIAGNOSIS — R079 Chest pain, unspecified: Secondary | ICD-10-CM | POA: Diagnosis present

## 2016-05-11 DIAGNOSIS — I10 Essential (primary) hypertension: Secondary | ICD-10-CM | POA: Insufficient documentation

## 2016-05-11 DIAGNOSIS — Z79899 Other long term (current) drug therapy: Secondary | ICD-10-CM | POA: Diagnosis not present

## 2016-05-11 DIAGNOSIS — K2970 Gastritis, unspecified, without bleeding: Secondary | ICD-10-CM | POA: Diagnosis not present

## 2016-05-11 LAB — BASIC METABOLIC PANEL
ANION GAP: 7 (ref 5–15)
BUN: 21 mg/dL — ABNORMAL HIGH (ref 6–20)
CALCIUM: 9.4 mg/dL (ref 8.9–10.3)
CHLORIDE: 102 mmol/L (ref 101–111)
CO2: 26 mmol/L (ref 22–32)
Creatinine, Ser: 1.04 mg/dL (ref 0.61–1.24)
GFR calc non Af Amer: >60 mL/min (ref >60)
GLUCOSE: 111 mg/dL — AB (ref 65–99)
Potassium: 4.2 mmol/L (ref 3.5–5.1)
Sodium: 135 mmol/L (ref 135–145)

## 2016-05-11 LAB — CBC
HCT: 45.9 % (ref 40.0–52.0)
HEMOGLOBIN: 15.5 g/dL (ref 13.0–18.0)
MCH: 31 pg (ref 26.0–34.0)
MCHC: 33.8 g/dL (ref 32.0–36.0)
MCV: 91.7 fL (ref 80.0–100.0)
Platelets: 281 10*3/uL (ref 150–440)
RBC: 5 MIL/uL (ref 4.40–5.90)
RDW: 13.3 % (ref 11.5–14.5)
WBC: 12.7 10*3/uL — ABNORMAL HIGH (ref 3.8–10.6)

## 2016-05-11 LAB — TROPONIN I: Troponin I: <0.03 ng/mL (ref <0.03)

## 2016-05-11 MED ORDER — GI COCKTAIL ~~LOC~~
30.0000 mL | Freq: Once | ORAL | Status: AC
  Administered 2016-05-11: 30 mL via ORAL

## 2016-05-11 MED ORDER — SUCRALFATE 1 G PO TABS: 1.0000 g | ORAL_TABLET | Freq: Four times a day (QID) | ORAL | 0 refills | Status: DC

## 2016-05-11 MED ORDER — GI COCKTAIL ~~LOC~~
ORAL | Status: DC
Start: 2016-05-11 — End: 2016-05-12
  Filled 2016-05-11: qty 30

## 2016-05-11 MED ORDER — FAMOTIDINE 40 MG PO TABS: 40.0000 mg | ORAL_TABLET | Freq: Every evening | ORAL | 1 refills | Status: DC

## 2016-05-11 NOTE — Discharge Instructions (Signed)
Please seek medical attention for any high fevers, chest pain, shortness of breath, change in behavior, persistent vomiting, bloody stool or any other new or concerning symptoms.  

## 2016-05-11 NOTE — ED Provider Notes (Signed)
[facility] Emergency Department Provider Note  ____________________________________________   I have reviewed the triage vital signs and the nursing notes.   HISTORY  Chief Complaint Chest Pain   History limited by: Language [facility] Interpreter utilized   HPI Thomas Gaines is a 51 y.o. male who presents to the emergency department today because of concern for chest pain in the setting of high blood pressure. He was seen in the emergency department two days ago because of concern for high blood pressure. He has contacted his primary care doctor who has set him up with cardiology appointment. Today however he started developing chest pain. Started in the morning and has persisted. Located in the center chest. Worse after eating or drinking a coke. States he takes a home remedy of garlic in lemon juice for high blood pressure frequently.    Past Medical History:  Diagnosis Date  . Hyperlipidemia   . Hyperlipidemia   . Hypertension     Patient Active Problem List   Diagnosis Date Noted  . Allergic rhinitis 01/22/2016  . Anxiety 01/22/2016  . Depression 01/22/2016  . GERD (gastroesophageal reflux disease) 01/22/2016  . Hyperlipidemia 01/22/2016  . Hypertension 01/22/2016  . Tonsillar hypertrophy 01/22/2016  . Essential hypertension 10/23/2015  . Heart palpitations 10/23/2015  . RBBB 10/23/2015  . SOB (shortness of breath) on exertion 10/23/2015  . OSA on CPAP 04/01/2015  . Restless legs 04/01/2015  . Sleep disorder 04/01/2015  . Iron disorder 11/06/2013  . OSA (obstructive sleep apnea) 11/06/2013  . 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 Allergic rhinitis due to pollen 06/17/2011  . Deviated nasal septum 05/18/2011  . Hypertrophy of tonsils 05/18/2011  . Obstructive sleep apnea 05/18/2011  . Disorder of upper respiratory system 05/18/2011    Past Surgical History:  Procedure Laterality Date  . CATARACT EXTRACTION      Prior to  Admission medications   Medication Sig Start Date End Date Taking? Authorizing Provider  gabapentin (NEURONTIN) 300 MG capsule Take by mouth. 03/25/15   Historical Provider, MD  lisinopril (PRINIVIL,ZESTRIL) 10 MG tablet Take by mouth. 11/19/15   Historical Provider, MD  metoprolol succinate (TOPROL-XL) 100 MG 24 hr tablet Take by mouth. 03/04/15   Historical Provider, MD  simvastatin (ZOCOR) 20 MG tablet Take by mouth. 03/26/15   Historical Provider, MD  sulfamethoxazole-trimethoprim (BACTRIM DS,SEPTRA DS) 800-160 MG tablet Take 1 tablet by mouth 2 (two) times daily.    Historical Provider, MD    Allergies Patient has no known allergies.  Family History  Problem Relation Age of Onset  . Prostate cancer Neg Hx   . Bladder Cancer Neg Hx   . Kidney cancer Neg Hx     Social History Social History  Substance Use Topics  . Smoking status: Never Smoker  . Smokeless tobacco: Never Used  . Alcohol use No    Review of Systems  Constitutional: Negative for fever. Cardiovascular: Positive for chest pain. Respiratory: Negative for shortness of breath. Gastrointestinal: Negative for abdominal pain, vomiting and diarrhea. Neurological: Negative for headaches, focal weakness or numbness.  10-point ROS otherwise negative.  ____________________________________________   PHYSICAL EXAM:  VITAL SIGNS: ED Triage Vitals  Enc Vitals Group     BP 05/11/16 1921 (!) 159/97     Pulse Rate 05/11/16 1921 90     Resp 05/11/16 1921 16     Temp 05/11/16 1921 98 F (36.7 C)     Temp Source 05/11/16 1921 Oral  SpO2 05/11/16 1921 98 %     Weight 05/11/16 1921 192 lb (87.1 kg)     Height 05/11/16 1921  (1.702 m)     Head Circumference --      Peak Flow --      Pain Score 05/11/16 2002 8   Constitutional: Alert and oriented. Well appearing and in no distress. Eyes: Conjunctivae are normal. Normal extraocular movements. ENT   Head: Normocephalic and atraumatic.   Nose: No  congestion/rhinnorhea.   Mouth/Throat: Mucous membranes are moist.   Neck: No stridor. Hematological/Lymphatic/Immunilogical: No cervical lymphadenopathy. Cardiovascular: Normal rate, regular rhythm.  No murmurs, rubs, or gallops.  Respiratory: Normal respiratory effort without tachypnea nor retractions. Breath sounds are clear and equal bilaterally. No wheezes/rales/rhonchi. Gastrointestinal: Soft and non tender. No rebound. No guarding.  Genitourinary: Deferred Musculoskeletal: Normal range of motion in all extremities. No lower extremity edema. Neurologic:  Normal speech and language. No gross focal neurologic deficits are appreciated.  Skin:  Skin is warm, dry and intact. No rash noted. Psychiatric: Mood and affect are normal. Speech and behavior are normal. Patient exhibits appropriate insight and judgment.  ____________________________________________    LABS (pertinent positives/negatives)  Labs Reviewed  BASIC METABOLIC PANEL - Abnormal; Notable for the following:       Result Value   Glucose, Bld 111 (*)    BUN 21 (*)    All other components within normal limits  CBC - Abnormal; Notable for the following:    WBC 12.7 (*)    All other components within normal limits  TROPONIN I     ____________________________________________   EKG  I, Phineas Semen, attending physician, personally viewed and interpreted this EKG  EKG Time: 1926 Rate: 91 Rhythm: normal sinus rhythm Axis: left axis deviation Intervals: qtc 472 QRS: LVH ST changes: no st elevation Impression: abnormal ekg  ____________________________________________    RADIOLOGY  CXR IMPRESSION: No active cardiopulmonary disease. ____________________________________________   PROCEDURES  Procedures  ____________________________________________   INITIAL IMPRESSION / ASSESSMENT AND PLAN / ED COURSE  Pertinent labs & imaging results that were available during my care of the patient were  reviewed by me and considered in my medical decision making (see chart for details).  Patient presented to the emergency department today because of concerns for chest pain and high blood pressure. Patient is working with his primary care doctor to get his blood pressure under control. Patient's pain did improve after GI cocktail. Think gastritis/esophagitis likely cause of the chest pain today. Will discharge with antacid and sucralfate.  ____________________________________________   FINAL CLINICAL IMPRESSION(S) / ED DIAGNOSES  Final diagnoses:  Gastritis without bleeding, unspecified chronicity, unspecified gastritis type     Note: This dictation was prepared with Dragon dictation. Any transcriptional errors that result from this process are unintentional     Phineas Semen, MD 05/11/16 2118

## 2016-05-11 NOTE — ED Triage Notes (Addendum)
Pt ambulatory to triage with steady gait, no distress noted. Pt c/o of left sided chest pain that radiates into left arm and into back. Pt sts he feels his BP is elevated and is having SOB and blurred vision. Pt sts he is on BP medication and has been compliant with taking.

## 2016-05-11 NOTE — ED Notes (Signed)
Pt reports he feels better, states his BP is better and head does not hurt as much as it did earlier. MD notified.

## 2016-05-16 ENCOUNTER — Emergency Department: Payer: BLUE CROSS/BLUE SHIELD

## 2016-05-16 ENCOUNTER — Encounter: Payer: Self-pay | Admitting: Emergency Medicine

## 2016-05-16 DIAGNOSIS — I10 Essential (primary) hypertension: Secondary | ICD-10-CM | POA: Diagnosis not present

## 2016-05-16 DIAGNOSIS — R079 Chest pain, unspecified: Secondary | ICD-10-CM | POA: Insufficient documentation

## 2016-05-16 LAB — BASIC METABOLIC PANEL
ANION GAP: 9 (ref 5–15)
BUN: 31 mg/dL — ABNORMAL HIGH (ref 6–20)
CALCIUM: 9.3 mg/dL (ref 8.9–10.3)
CO2: 26 mmol/L (ref 22–32)
Chloride: 100 mmol/L — ABNORMAL LOW (ref 101–111)
Creatinine, Ser: 1.02 mg/dL (ref 0.61–1.24)
GFR calc non Af Amer: >60 mL/min (ref >60)
Glucose, Bld: 126 mg/dL — ABNORMAL HIGH (ref 65–99)
POTASSIUM: 3.7 mmol/L (ref 3.5–5.1)
Sodium: 135 mmol/L (ref 135–145)

## 2016-05-16 LAB — CBC
HCT: 44.9 % (ref 40.0–52.0)
HEMOGLOBIN: 15.3 g/dL (ref 13.0–18.0)
MCH: 30.8 pg (ref 26.0–34.0)
MCHC: 34.1 g/dL (ref 32.0–36.0)
MCV: 90.4 fL (ref 80.0–100.0)
PLATELETS: 280 10*3/uL (ref 150–440)
RBC: 4.97 MIL/uL (ref 4.40–5.90)
RDW: 13.2 % (ref 11.5–14.5)
WBC: 13.6 10*3/uL — AB (ref 3.8–10.6)

## 2016-05-16 LAB — TROPONIN I

## 2016-05-16 NOTE — ED Triage Notes (Signed)
Pt to triage via w/c with no distress noted; pt reports via [facility] interpreter, having left sided since afternoon radiating into back accomp by dizziness; st hx of same and told it was "his esophagus"

## 2016-05-17 ENCOUNTER — Emergency Department
Admission: EM | Admit: 2016-05-17 | Discharge: 2016-05-17 | Disposition: A | Discharge status: Home / Self Care | Payer: BLUE CROSS/BLUE SHIELD | Attending: Emergency Medicine | Admitting: Emergency Medicine

## 2016-05-17 DIAGNOSIS — R079 Chest pain, unspecified: Secondary | ICD-10-CM | POA: Insufficient documentation

## 2016-05-17 LAB — TROPONIN I: Troponin I: <0.03 ng/mL (ref <0.03)

## 2016-05-17 LAB — FIBRIN DERIVATIVES D-DIMER (ARMC ONLY): Fibrin derivatives D-dimer (ARMC): 134.95 (ref 0.00–499.00)

## 2016-05-17 MED ORDER — LORATADINE 10 MG PO TABS: 10.0000 mg | ORAL_TABLET | Freq: Every day | ORAL | 0 refills | Status: DC

## 2016-05-17 NOTE — ED Notes (Signed)
[employer] interpreter used for assessment.

## 2016-05-17 NOTE — ED Provider Notes (Signed)
[facility] Emergency Department Provider Note    First MD Initiated Contact with Patient 05/17/16 0045     (approximate)  I have reviewed the triage vital signs and the nursing notes.   HISTORY  Chief Complaint Chest Pain   HPI Thomas Gaines is a 51 y.o. male with below list of chronic medical conditions presents emergency department intermittent left-sided chest pain that radiates to his back company by dizziness times "few months". Patient states when the pain occurs it lasts a few minutes all of by spontaneous resolution. Patient denies any dyspnea no lower extremity pain or swelling. Patient states he was seen in the emergency department for this before and referred to cardiology and had a negative stress test performed. Patient denies any pain at present current pain score 0 out of 10   Past Medical History:  Diagnosis Date  . Hyperlipidemia   . Hyperlipidemia   . Hypertension     Patient Active Problem List   Diagnosis Date Noted  . Allergic rhinitis 01/22/2016  . Anxiety 01/22/2016  . Depression 01/22/2016  . GERD (gastroesophageal reflux disease) 01/22/2016  . Hyperlipidemia 01/22/2016  . Hypertension 01/22/2016  . Tonsillar hypertrophy 01/22/2016  . Essential hypertension 10/23/2015  . Heart palpitations 10/23/2015  . RBBB 10/23/2015  . SOB (shortness of breath) on exertion 10/23/2015  . OSA on CPAP 04/01/2015  . Restless legs 04/01/2015  . Sleep disorder 04/01/2015  . Iron disorder 11/06/2013  . OSA (obstructive sleep apnea) 11/06/2013  . 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 Allergic rhinitis due to pollen 06/17/2011  . Deviated nasal septum 05/18/2011  . Hypertrophy of tonsils 05/18/2011  . Obstructive sleep apnea 05/18/2011  . Disorder of upper respiratory system 05/18/2011    Past Surgical History:  Procedure Laterality Date  . CATARACT EXTRACTION      Prior to Admission medications   Medication Sig Start Date End Date  Taking? Authorizing Provider  famotidine (PEPCID) 40 MG tablet Take 1 tablet (40 mg total) by mouth every evening. 05/11/16 05/11/17  Phineas Semen, MD  gabapentin (NEURONTIN) 300 MG capsule Take by mouth. 03/25/15   Historical Provider, MD  lisinopril (PRINIVIL,ZESTRIL) 10 MG tablet Take by mouth. 11/19/15   Historical Provider, MD  metoprolol succinate (TOPROL-XL) 100 MG 24 hr tablet Take by mouth. 03/04/15   Historical Provider, MD  simvastatin (ZOCOR) 20 MG tablet Take by mouth. 03/26/15   Historical Provider, MD  sucralfate (CARAFATE) 1 g tablet Take 1 tablet (1 g total) by mouth 4 (four) times daily. 05/11/16   Phineas Semen, MD  sulfamethoxazole-trimethoprim (BACTRIM DS,SEPTRA DS) 800-160 MG tablet Take 1 tablet by mouth 2 (two) times daily.    Historical Provider, MD    Allergies Patient has no known allergies.  Family History  Problem Relation Age of Onset  . Prostate cancer Neg Hx   . Bladder Cancer Neg Hx   . Kidney cancer Neg Hx     Social History Social History  Substance Use Topics  . Smoking status: Never Smoker  . Smokeless tobacco: Never Used  . Alcohol use No    Review of Systems Constitutional: No fever/chills Eyes: No visual changes. ENT: No sore throat. Cardiovascular: Denies chest pain. Respiratory: Denies shortness of breath. Gastrointestinal: No abdominal pain.  No nausea, no vomiting.  No diarrhea.  No constipation. Genitourinary: Negative for dysuria. Musculoskeletal: Negative for back pain. Skin: Negative for rash. Neurological: Negative for headaches, focal weakness or numbness.  10-point ROS otherwise negative.  ____________________________________________   PHYSICAL EXAM:  VITAL SIGNS: ED Triage Vitals  Enc Vitals Group     BP 05/17/16 0100 (!) 149/91     Pulse Rate 05/17/16 0100 77     Resp 05/17/16 0100 15     Temp --      Temp src --      SpO2 05/17/16 0100 100 %     Weight 05/16/16 2254 192 lb (87.1 kg)     Height 05/16/16 2254 5'  7" (1.702 m)     Head Circumference --      Peak Flow --      Pain Score 05/16/16 2254 9     Pain Loc --      Pain Edu? --      Excl. in GC? --     Constitutional: Alert and oriented. Well appearing and in no acute distress. Eyes: Conjunctivae are normal. PERRL. EOMI. Head: Atraumatic. Mouth/Throat: Mucous membranes are moist.  Oropharynx non-erythematous. Neck: No stridor.  Cardiovascular: Normal rate, regular rhythm. Good peripheral circulation. Grossly normal heart sounds. Respiratory: Normal respiratory effort.  No retractions. Lungs CTAB. Gastrointestinal: Soft and nontender. No distention.  Musculoskeletal: No lower extremity tenderness nor edema. No gross deformities of extremities. Neurologic:  Normal speech and language. No gross focal neurologic deficits are appreciated.  Skin:  Skin is warm, dry and intact. No rash noted. Psychiatric: Mood and affect are normal. Speech and behavior are normal.  ____________________________________________   LABS (all labs ordered are listed, but only abnormal results are displayed)  Labs Reviewed  BASIC METABOLIC PANEL - Abnormal; Notable for the following:       Result Value   Chloride 100 (*)    Glucose, Bld 126 (*)    BUN 31 (*)    All other components within normal limits  CBC - Abnormal; Notable for the following:    WBC 13.6 (*)    All other components within normal limits  TROPONIN I  TROPONIN I  FIBRIN DERIVATIVES D-DIMER ([facility] ONLY)   ____________________________________________  EKG  ED ECG REPORT I, [place] N BROWN, the attending physician, personally viewed and interpreted this ECG.   Date: 05/17/2016  EKG Time: 10:58 PM  Rate: 77  Rhythm: Normal sinus rhythm  Axis: Normal  Intervals: Normal  ST&T Change: None  ____________________________________________  RADIOLOGY I, [place] N BROWN, personally viewed and evaluated these images (plain radiographs) as part of my medical decision making, as well as  reviewing the written report by the radiologist.  Dg Chest 2 View  Result Date: 05/16/2016 CLINICAL DATA:  Chest pain EXAM: CHEST  2 VIEW COMPARISON:  Chest radiograph 05/11/2016 FINDINGS: The heart size and mediastinal contours are within normal limits. Both lungs are clear. The visualized skeletal structures are unremarkable. IMPRESSION: No active cardiopulmonary disease. Electronically Signed   By: Deatra Robinson M.D.   On: 05/16/2016 23:26      Procedures   ____________________________________________   INITIAL IMPRESSION / ASSESSMENT AND PLAN / ED COURSE  Pertinent labs & imaging results that were available during my care of the patient were reviewed by me and considered in my medical decision making (see chart for details).  51 year old male presenting in the emergency department with left-sided chest pain EKG revealed no evidence of ischemia or infarction troponin negative 2 . Consider the possibility of pulmonary emboli as such d-dimer obtained which is -134. As such patient will be referred to primary care provider for further outpatient evaluation of chest pain.  ____________________________________________  FINAL CLINICAL IMPRESSION(S) / ED DIAGNOSES  Final diagnoses:  Nonspecific chest pain     MEDICATIONS GIVEN DURING THIS VISIT:  Medications - No data to display   NEW OUTPATIENT MEDICATIONS STARTED DURING THIS VISIT:  New Prescriptions   No medications on file    Modified Medications   No medications on file    Discontinued Medications   No medications on file     Note:  This document was prepared using Dragon voice recognition software and may include unintentional dictation errors.    Darci Current, MD 05/17/16 [phone]

## 2016-05-30 ENCOUNTER — Ambulatory Visit: Payer: BLUE CROSS/BLUE SHIELD | Attending: Urology | Admitting: Physical Therapy

## 2016-06-08 ENCOUNTER — Ambulatory Visit: Payer: BLUE CROSS/BLUE SHIELD | Attending: Urology | Admitting: Physical Therapy

## 2016-07-06 NOTE — Progress Notes (Deleted)
07/07/2016 8:03 AM   Thomas Gaines Jun 09, 1965 [id number]  Referring provider: Hyman Hopes, MD [address] [place], [place] [address]  No chief complaint on file.   HPI: 50 yo HM who presents today for follow up after being evaluated by PT for left testicular pain.  Background history The patient is a 51 year old gentleman who presents today for evaluation of left testicular pain. He has been treated with Bactrim with no relief. The pain is mainly located in his left testicle but also radiates to his penis. His main concern though is the pain in his left testicle. It is a dull squeezing pain. Has been present for 2 weeks. Do not have this pain prior. He has mild dysuria with urination as well. He has been on Bactrim which has not helped this persisted. His urine cultures as well as STD screening were negative. He has never had this pain before. He denies trauma to his groin. He has no issues of urinary tract infections. His urinalysis today is unremarkable. Exam was benign at first visit. At his last visit he was started on ibuprofen and icing of his scrotum.  He did not do this that often. He feels that he has a mass in his left testicle. When he touches that he has pain for 4-5 days. He is concerned by this but not able to reproduce or locate on repeat exam.  He returns today to review scrotal ultrasound results which were unremarkable with no masses. He does again note that his strenuous job does seem to exacerbate this pain. Advil has not helped with his discomfort.    Patient was seen once for PT.     PMH: Past Medical History:  Diagnosis Date  . Hyperlipidemia   . Hyperlipidemia   . Hypertension     Surgical History: Past Surgical History:  Procedure Laterality Date  . CATARACT EXTRACTION      Home Medications:  Allergies as of 07/07/2016   No Known Allergies     Medication List       Accurate as of 07/06/16  8:03 AM. Always use your most  recent med list.          famotidine 40 MG tablet Commonly known as:  PEPCID Take 1 tablet (40 mg total) by mouth every evening.   gabapentin 300 MG capsule Commonly known as:  NEURONTIN Take by mouth.   lisinopril 10 MG tablet Commonly known as:  PRINIVIL,ZESTRIL Take by mouth.   loratadine 10 MG tablet Commonly known as:  CLARITIN Take 1 tablet (10 mg total) by mouth daily.   metoprolol succinate 100 MG 24 hr tablet Commonly known as:  TOPROL-XL Take by mouth.   simvastatin 20 MG tablet Commonly known as:  ZOCOR Take by mouth.   sucralfate 1 g tablet Commonly known as:  CARAFATE Take 1 tablet (1 g total) by mouth 4 (four) times daily.   sulfamethoxazole-trimethoprim 800-160 MG tablet Commonly known as:  BACTRIM DS,SEPTRA DS Take 1 tablet by mouth 2 (two) times daily.       Allergies: No Known Allergies  Family History: Family History  Problem Relation Age of Onset  . Prostate cancer Neg Hx   . Bladder Cancer Neg Hx   . Kidney cancer Neg Hx     Social History:  reports that he has never smoked. He has never used smokeless tobacco. He reports that he has current or past drug history, including Anabolic steroids. He reports that he  does not drink alcohol.  ROS:                                        Physical Exam: There were no vitals taken for this visit.  Constitutional:  Alert and oriented, No acute distress. HEENT: [place] AT, moist mucus membranes.  Trachea midline, no masses. Cardiovascular: No clubbing, cyanosis, or edema. Respiratory: Normal respiratory effort, no increased work of breathing. GI: Abdomen is soft, nontender, nondistended, no abdominal masses GU: No CVA tenderness.  Skin: No rashes, bruises or suspicious lesions. Lymph: No cervical or inguinal adenopathy. Neurologic: Grossly intact, no focal deficits, moving all 4 extremities. Psychiatric: Normal mood and affect.  Laboratory Data: Lab Results  Component  Value Date   WBC 13.6 (H) 05/16/2016   HGB 15.3 05/16/2016   HCT 44.9 05/16/2016   MCV 90.4 05/16/2016   PLT 280 05/16/2016    Lab Results  Component Value Date   CREATININE 1.02 05/16/2016    Urinalysis    Component Value Date/Time   COLORURINE Colorless 10/31/2013 0412   APPEARANCEUR Clear 01/22/2016 1005   LABSPEC 1.002 10/31/2013 0412   PHURINE 6.0 10/31/2013 0412   GLUCOSEU Negative 01/22/2016 1005   GLUCOSEU Negative 10/31/2013 0412   HGBUR 1+ 10/31/2013 0412   BILIRUBINUR Negative 01/22/2016 1005   BILIRUBINUR Negative 10/31/2013 0412   KETONESUR Negative 10/31/2013 0412   PROTEINUR Negative 01/22/2016 1005   PROTEINUR Negative 10/31/2013 0412   NITRITE Negative 01/22/2016 1005   NITRITE Negative 10/31/2013 0412   LEUKOCYTESUR Negative 01/22/2016 1005   LEUKOCYTESUR Negative 10/31/2013 0412    Pertinent Imaging: Negative scrotal ultrasound  Assessment & Plan:    1. Left testicular pain The patient has no obvious pathological reason for his testicular pain. I think it is likely related to his pelvic floor muscles especially since it is exacerbated by his strenuous job. I think could benefit from pelvic floor physical therapy. We will have him see our pelvic floor physical therapist and follow-up with us in a few months after undergoing therapy.  No Follow-up on file.  Michiel CowboySHANNON Bevely Hackbart, PA-C  [facility] [address], [address] [place], [place] [id number] [phone]

## 2016-07-07 ENCOUNTER — Encounter: Payer: Self-pay | Admitting: Urology

## 2016-07-07 ENCOUNTER — Ambulatory Visit: Payer: BLUE CROSS/BLUE SHIELD | Admitting: Urology

## 2016-08-18 ENCOUNTER — Ambulatory Visit: Payer: BLUE CROSS/BLUE SHIELD | Admitting: Urology

## 2016-08-18 ENCOUNTER — Emergency Department: Payer: BLUE CROSS/BLUE SHIELD

## 2016-08-18 ENCOUNTER — Emergency Department
Admission: EM | Admit: 2016-08-18 | Discharge: 2016-08-19 | Disposition: A | Discharge status: Home / Self Care | Payer: BLUE CROSS/BLUE SHIELD | Attending: Emergency Medicine | Admitting: Emergency Medicine

## 2016-08-18 ENCOUNTER — Encounter: Payer: Self-pay | Admitting: Urology

## 2016-08-18 VITALS — BP 126/81 | HR 64 | Ht 66.0 in | Wt 184.6 lb

## 2016-08-18 DIAGNOSIS — I10 Essential (primary) hypertension: Secondary | ICD-10-CM | POA: Insufficient documentation

## 2016-08-18 DIAGNOSIS — R202 Paresthesia of skin: Secondary | ICD-10-CM | POA: Diagnosis not present

## 2016-08-18 DIAGNOSIS — Z79899 Other long term (current) drug therapy: Secondary | ICD-10-CM | POA: Insufficient documentation

## 2016-08-18 DIAGNOSIS — R200 Anesthesia of skin: Secondary | ICD-10-CM | POA: Diagnosis present

## 2016-08-18 DIAGNOSIS — N50819 Testicular pain, unspecified: Secondary | ICD-10-CM

## 2016-08-18 LAB — CBC
HEMATOCRIT: 42.6 % (ref 40.0–52.0)
HEMOGLOBIN: 14.7 g/dL (ref 13.0–18.0)
MCH: 31.2 pg (ref 26.0–34.0)
MCHC: 34.6 g/dL (ref 32.0–36.0)
MCV: 90.1 fL (ref 80.0–100.0)
Platelets: 224 10*3/uL (ref 150–440)
RBC: 4.72 MIL/uL (ref 4.40–5.90)
RDW: 13 % (ref 11.5–14.5)
WBC: 10 10*3/uL (ref 3.8–10.6)

## 2016-08-18 LAB — COMPREHENSIVE METABOLIC PANEL
ALK PHOS: 81 U/L (ref 38–126)
ALT: 34 U/L (ref 17–63)
ANION GAP: 7 (ref 5–15)
AST: 26 U/L (ref 15–41)
Albumin: 4.5 g/dL (ref 3.5–5.0)
BILIRUBIN TOTAL: 0.8 mg/dL (ref 0.3–1.2)
BUN: 19 mg/dL (ref 6–20)
CALCIUM: 8.9 mg/dL (ref 8.9–10.3)
CO2: 26 mmol/L (ref 22–32)
Chloride: 104 mmol/L (ref 101–111)
Creatinine, Ser: 0.88 mg/dL (ref 0.61–1.24)
Glucose, Bld: 132 mg/dL — ABNORMAL HIGH (ref 65–99)
Potassium: 4 mmol/L (ref 3.5–5.1)
Sodium: 137 mmol/L (ref 135–145)
TOTAL PROTEIN: 8.4 g/dL — AB (ref 6.5–8.1)

## 2016-08-18 LAB — TROPONIN I: Troponin I: <0.03 ng/mL (ref <0.03)

## 2016-08-18 NOTE — ED Triage Notes (Signed)
Pt in with co right foot numbness for a week, states since 1130 the numbness has radiated to right leg, right arm, and now co dizziness. No hx of the same, saw cardiologist and urologist today but did not mention symptoms.

## 2016-08-18 NOTE — ED Notes (Signed)
Orders per Dr. Darnelle CatalanMalinda, no code stroke at this time.

## 2016-08-18 NOTE — Progress Notes (Signed)
08/18/2016 10:57 AM   Thomas Gaines [birth year] [id number]  Referring provider: Hyman HopesBurns, Harriett P, MD [address] [place], [place] [address]  Chief Complaint  Patient presents with  . Follow-up    3 month  Testicular pain    HPI: The patient is a 51 year old gentleman who presents today for follow up of left testicular pain. He has been treated with Bactrim with no relief. The pain is mainly located in his left testicle but also radiates to his penis. His main concern though is the pain in his left testicle. It is a dull squeezing pain. Has been present for 2 weeks. Do not have this pain prior. He has mild dysuria with urination as well. He has been on Bactrim which has not helped this persisted. His urine cultures as well as STD screening were negative. He has never had this pain before. He denies trauma to his groin. He has no issues of urinary tract infections. His urinalysis today is unremarkable. Exam was benign at first visit.  At his last visit he was started on ibuprofen and icing of his scrotum. He did not do this that often. He feels that he has a mass in his left testicle. When he touches that he has pain for 4-5 days. He is concerned by this but not able to reproduce or locate on repeat exam.    Scrotal ultrasound results was unremarkable with no masses. He does again note that his strenuous job does seem to exacerbate this pain. Advil has not helped with his discomfort.  He was recommended to start physical therapy, however he only went to one treatment. He did not return as his pain dramatically improved after just one session. He last time had pain within his testicle approximately one month ago. He has had none since that time. Also in the interim, he has switched to a less strenuous job.     PMH: Past Medical History:  Diagnosis Date  . Hyperlipidemia   . Hyperlipidemia   . Hypertension     Surgical History: Past Surgical History:  Procedure  Laterality Date  . CATARACT EXTRACTION      Home Medications:  Allergies as of 08/18/2016   No Known Allergies     Medication List       Accurate as of 08/18/16 10:57 AM. Always use your most recent med list.          famotidine 40 MG tablet Commonly known as:  PEPCID Take 1 tablet (40 mg total) by mouth every evening.   gabapentin 300 MG capsule Commonly known as:  NEURONTIN Take by mouth.   isosorbide mononitrate 30 MG 24 hr tablet Commonly known as:  IMDUR Take by mouth.   lisinopril 10 MG tablet Commonly known as:  PRINIVIL,ZESTRIL Take by mouth.   loratadine 10 MG tablet Commonly known as:  CLARITIN Take 1 tablet (10 mg total) by mouth daily.   metoprolol succinate 100 MG 24 hr tablet Commonly known as:  TOPROL-XL Take by mouth.   simvastatin 20 MG tablet Commonly known as:  ZOCOR Take by mouth.   sucralfate 1 g tablet Commonly known as:  CARAFATE Take 1 tablet (1 g total) by mouth 4 (four) times daily.   sulfamethoxazole-trimethoprim 800-160 MG tablet Commonly known as:  BACTRIM DS,SEPTRA DS Take 1 tablet by mouth 2 (two) times daily.       Allergies: No Known Allergies  Family History: Family History  Problem Relation Age of Onset  .  Prostate cancer Neg Hx   . Bladder Cancer Neg Hx   . Kidney cancer Neg Hx     Social History:  reports that he has never smoked. He has never used smokeless tobacco. He reports that he does not drink alcohol or use drugs.  ROS: UROLOGY Frequent Urination?: No Hard to postpone urination?: No Burning/pain with urination?: No Get up at night to urinate?: No Leakage of urine?: No Urine stream starts and stops?: No Trouble starting stream?: No Do you have to strain to urinate?: No Blood in urine?: No Urinary tract infection?: No Sexually transmitted disease?: No Injury to kidneys or bladder?: No Painful intercourse?: No Weak stream?: No Erection problems?: No Penile pain?:  No  Gastrointestinal Nausea?: No Vomiting?: No Indigestion/heartburn?: No Diarrhea?: No Constipation?: No  Constitutional Fever: No Night sweats?: No Weight loss?: No Fatigue?: No  Skin Skin rash/lesions?: No Itching?: No  Eyes Blurred vision?: No Double vision?: No  Ears/Nose/Throat Sore throat?: No Sinus problems?: No  Hematologic/Lymphatic Swollen glands?: No Easy bruising?: No  Cardiovascular Leg swelling?: No Chest pain?: No  Respiratory Cough?: No Shortness of breath?: No  Endocrine Excessive thirst?: No  Musculoskeletal Back pain?: No Joint pain?: No  Neurological Headaches?: No Dizziness?: No  Psychologic Depression?: No Anxiety?: No  Physical Exam: BP 126/81   Pulse 64   Ht 5\' 6"  (1.676 m)   Wt 184 lb 9.6 oz (83.7 kg)   BMI 29.80 kg/m   Constitutional:  Alert and oriented, No acute distress. HEENT: [place] AT, moist mucus membranes.  Trachea midline, no masses. Cardiovascular: No clubbing, cyanosis, or edema. Respiratory: Normal respiratory effort, no increased work of breathing. GI: Abdomen is soft, nontender, nondistended, no abdominal masses GU: No CVA tenderness.  Skin: No rashes, bruises or suspicious lesions. Lymph: No cervical or inguinal adenopathy. Neurologic: Grossly intact, no focal deficits, moving all 4 extremities. Psychiatric: Normal mood and affect.  Laboratory Data: Lab Results  Component Value Date   WBC 13.6 (H) 05/16/2016   HGB 15.3 05/16/2016   HCT 44.9 05/16/2016   MCV 90.4 05/16/2016   PLT 280 05/16/2016    Lab Results  Component Value Date   CREATININE 1.02 05/16/2016    No results found for: PSA  No results found for: TESTOSTERONE  No results found for: HGBA1C  Urinalysis    Component Value Date/Time   COLORURINE Colorless 10/31/2013 0412   APPEARANCEUR Clear 01/22/2016 1005   LABSPEC 1.002 10/31/2013 0412   PHURINE 6.0 10/31/2013 0412   GLUCOSEU Negative 01/22/2016 1005   GLUCOSEU  Negative 10/31/2013 0412   HGBUR 1+ 10/31/2013 0412   BILIRUBINUR Negative 01/22/2016 1005   BILIRUBINUR Negative 10/31/2013 0412   KETONESUR Negative 10/31/2013 0412   PROTEINUR Negative 01/22/2016 1005   PROTEINUR Negative 10/31/2013 0412   NITRITE Negative 01/22/2016 1005   NITRITE Negative 10/31/2013 0412   LEUKOCYTESUR Negative 01/22/2016 1005   LEUKOCYTESUR Negative 10/31/2013 0412     Assessment & Plan:    1. Left testicular pain This has since resolved with physical therapy. Affected a change work to a less strenuous position also is likely helping the improvement in his symptoms. At this point, he needs no further intervention. I reassured him that he has no sign of malignancy of his testicles. He will follow-up with [place] as needed. If his pain returns, completion of physical therapy would be of benefit to him.  Return if symptoms worsen or fail to improve.  Hildred Laser, MD  [facility]  [address] [place], [place] [address] [phone]

## 2016-08-18 NOTE — ED Notes (Signed)
Interpreter Thomas Gaines present.  Pt states that he has had R leg numbness x1 week, but that it increased tonight and that he started to have R arm numbness as well.  Pt is A&Ox4.  Pt denies taking blood pressure medication this evening, but states that he normally takes it every night.

## 2016-08-19 ENCOUNTER — Emergency Department: Payer: BLUE CROSS/BLUE SHIELD

## 2016-08-19 NOTE — Discharge Instructions (Signed)
Your workup in the Emergency Department today was reassuring.  We did not find any specific abnormalities.  We recommend you drink plenty of fluids, take your regular medications and/or any new ones prescribed today, and follow up with the doctor(s) listed in these documents as recommended.  Return to the Emergency Department if you develop new or worsening symptoms that concern you.  

## 2016-08-19 NOTE — ED Provider Notes (Signed)
[facility] Emergency Department Provider Note  ____________________________________________   First MD Initiated Contact with Patient 08/19/16 0009     (approximate)  I have reviewed the triage vital signs and the nursing notes.   HISTORY  Chief Complaint Numbness  The patient and/or family speak(s) Spanish.  They understand they have the right to the use of a hospital interpreter, however at this time they prefer to speak directly with me in Spanish.  They know that they can ask for an interpreter at any time.   HPI Jones SkeneSergio Dirzo Ardyth HarpsHernandez is a 51 y.o. male with medical history as listed below who presents for evaluation of acute onset numbness in his right arm.  This is in the setting of about 1 week of some numbness in his right foot.  He states that was mild but became moderate tonight and then subsequently started having right arm numbness.  The onset of the symptoms started about 6 hours ago.  Nothing in particular makes the patient's symptoms better nor worse.  He has had no weakness, no difficulty with ambulation, no visual changes, no facial droop, no word finding difficulties. He does report some dizziness but he has had episodes of dizziness in the past and this does feel similar.  He had a visit today with his cardiologist and urologist and even did a treadmill stress test for the cardiologist today, but he reports that the numbness symptoms occurred or worsened after the stress test.  He denies fever/chills, neck pain, neck stiffness, headache, shortness of breath, chest pain, vomiting, abdominal pain.  He has no history of blood clots in his legs or lungs.  He sees Dr. Malvin JohnsPotter for restless leg syndrome and is on gabapentin.   Past Medical History:  Diagnosis Date  . Hyperlipidemia   . Hyperlipidemia   . Hypertension     Patient Active Problem List   Diagnosis Date Noted  . Allergic rhinitis 01/22/2016  . Anxiety 01/22/2016  . Depression  01/22/2016  . GERD (gastroesophageal reflux disease) 01/22/2016  . Hyperlipidemia 01/22/2016  . Hypertension 01/22/2016  . Tonsillar hypertrophy 01/22/2016  . Essential hypertension 10/23/2015  . Heart palpitations 10/23/2015  . RBBB 10/23/2015  . SOB (shortness of breath) on exertion 10/23/2015  . OSA on CPAP 04/01/2015  . Restless legs 04/01/2015  . Sleep disorder 04/01/2015  . Iron disorder 11/06/2013  . OSA (obstructive sleep apnea) 11/06/2013  . 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 Allergic rhinitis due to pollen 06/17/2011  . Deviated nasal septum 05/18/2011  . Hypertrophy of tonsils 05/18/2011  . Obstructive sleep apnea 05/18/2011  . Disorder of upper respiratory system 05/18/2011    Past Surgical History:  Procedure Laterality Date  . CATARACT EXTRACTION      Prior to Admission medications   Medication Sig Start Date End Date Taking? Authorizing Provider  gabapentin (NEURONTIN) 300 MG capsule Take 300 mg by mouth every evening.    Yes [provider]  lisinopril (PRINIVIL,ZESTRIL) 10 MG tablet Take 10 mg by mouth daily.    Yes [provider]  metoprolol succinate (TOPROL-XL) 100 MG 24 hr tablet Take 100 mg by mouth daily.    Yes [provider]  simvastatin (ZOCOR) 20 MG tablet Take 20 mg by mouth every evening.    Yes [provider]  famotidine (PEPCID) 40 MG tablet Take 1 tablet (40 mg total) by mouth every evening. Patient not taking: Reported on 08/18/2016 05/11/16 05/11/17  Phineas SemenGoodman, Graydon, MD  loratadine (CLARITIN) 10  MG tablet Take 1 tablet (10 mg total) by mouth daily. Patient not taking: Reported on 08/18/2016 05/17/16 05/17/17  Darci Current, MD  sucralfate (CARAFATE) 1 g tablet Take 1 tablet (1 g total) by mouth 4 (four) times daily. Patient not taking: Reported on 08/18/2016 05/11/16   Phineas Semen, MD  sulfamethoxazole-trimethoprim (BACTRIM DS,SEPTRA DS) 800-160 MG tablet Take 1 tablet by mouth 2 (two) times daily.    [provider]    Allergies Patient has no known allergies.  Family History  Problem Relation Age of Onset  . Prostate cancer Neg Hx   . Bladder Cancer Neg Hx   . Kidney cancer Neg Hx     Social History Social History  Substance Use Topics  . Smoking status: Never Smoker  . Smokeless tobacco: Never Used  . Alcohol use No    Review of Systems Constitutional: No fever/chills Eyes: No visual changes. ENT: No sore throat. Cardiovascular: Denies chest pain. Respiratory: Denies shortness of breath. Gastrointestinal: No abdominal pain.  No nausea, no vomiting.  No diarrhea.  No constipation. Genitourinary: Negative for dysuria. Musculoskeletal: Negative for neck pain.  Negative for back pain. Integumentary: Negative for rash. Neurological: Mild dizziness.  About one week of numbness/-decreased sensation in his right foot, now with acute onset of numbness and decreased sensation in his right arm.  No difficulty with ambulation.  No weakness.   ____________________________________________   PHYSICAL EXAM:  VITAL SIGNS: ED Triage Vitals [08/18/16 2211]  Enc Vitals Group     BP (!) 172/94     Pulse Rate 77     Resp 18     Temp 99 F (37.2 C)     Temp Source Oral     SpO2 100 %     Weight 83.9 kg (185 lb)     Height 1.651 m (5\' 5" )     Head Circumference      Peak Flow      Pain Score      Pain Loc      Pain Edu?      Excl. in GC?     Constitutional: Alert and oriented. Well appearing and in no acute distress. Eyes: Conjunctivae are normal. PERRL. EOMI.  No nystagmus Head: Atraumatic. Nose: No congestion/rhinnorhea. Mouth/Throat: Mucous membranes are moist. Neck: No stridor.  No meningeal signs.   Cardiovascular: Normal rate, regular rhythm. Good peripheral circulation. Grossly normal heart sounds. Respiratory: Normal respiratory effort.  No retractions. Lungs CTAB. Gastrointestinal: Soft and nontender. No distention.  Musculoskeletal: No lower extremity  tenderness nor edema. No gross deformities of extremities. Neurologic:  Normal speech and language. No gross focal neurologic deficits are appreciated except for the subjective decrease in sensation of the right arm and right foot.  Patient has no dysmetria on finger to nose testing, is ambulating without difficulty, and has normal muscle strength in the major muscle groups of his upper and lower extremities.  He has no gross cranial nerve deficits. Skin:  Skin is warm, dry and intact. No rash noted. Psychiatric: Mood and affect are anxious. Speech and behavior are normal.  ____________________________________________   LABS (all labs ordered are listed, but only abnormal results are displayed)  Labs Reviewed  COMPREHENSIVE METABOLIC PANEL - Abnormal; Notable for the following:       Result Value   Glucose, Bld 132 (*)    Total Protein 8.4 (*)    All other components within normal limits  CBC  TROPONIN I   ____________________________________________  EKG  ED ECG REPORT I, Aundre Hietala, the attending physician, personally viewed and interpreted this ECG.  Date: 08/18/2016 EKG Time: 22:15 Rate: 79 Rhythm: RSR' pattern in V1 with LVH QRS Axis: normal Intervals: normal ST/T Wave abnormalities: normal Narrative Interpretation: unremarkable with no evidence of acute ischemia  ____________________________________________  RADIOLOGY   Ct Head Wo Contrast  Result Date: 08/18/2016 CLINICAL DATA:  Right foot numbness for 1 week.  Dizziness. EXAM: CT HEAD WITHOUT CONTRAST TECHNIQUE: Contiguous axial images were obtained from the base of the skull through the vertex without intravenous contrast. COMPARISON:  Head CT 11/23/2014 FINDINGS: Brain: No intracranial hemorrhage, mass effect, or midline shift. No hydrocephalus. The basilar cisterns are patent. No evidence of territorial infarct. No extra-axial or intracranial fluid collection. Vascular: No hyperdense vessel. Skull: Normal.  Negative for fracture or focal lesion. Sinuses/Orbits: Paranasal sinuses and mastoid air cells are clear. The visualized orbits are unremarkable. Other: None. IMPRESSION: No acute intracranial abnormality. Electronically Signed   By: Rubye Oaks M.D.   On: 08/18/2016 22:44   Mr Brain Wo Contrast  Result Date: 08/19/2016 CLINICAL DATA:  Initial evaluation for acute right foot numbness for 1 week. EXAM: MRI HEAD WITHOUT CONTRAST TECHNIQUE: Multiplanar, multiecho pulse sequences of the brain and surrounding structures were obtained without intravenous contrast. COMPARISON:  Prior CT from 08/18/2016. FINDINGS: Brain: Cerebral volume within normal limits for age. Few tiny scattered subcentimeter T2/hyperintense foci noted within the supratentorial cerebral white matter, nonspecific, but of doubtful significance, and felt to be within normal limits for age. No abnormal foci of restricted diffusion to suggest acute or subacute ischemia. Gray-white matter differentiation well maintained. No encephalomalacia to suggest chronic infarction. No evidence for acute or chronic intracranial hemorrhage. No mass lesion, midline shift or mass effect. Ventricles normal in size without evidence for hydrocephalus. No extra-axial fluid collection. Major dural sinuses are grossly patent. Pituitary gland suprasellar region within normal limits. Midline structures intact and normal. Vascular: Major intracranial vascular flow voids are maintained. Skull and upper cervical spine: Craniocervical junction within normal limits. Visualized upper cervical spine unremarkable. Bone marrow signal intensity normal. No scalp soft tissue abnormality. Sinuses/Orbits: Globes and orbital soft tissues within normal limits. Paranasal sinuses are clear. No mastoid effusion. Inner ear structures normal. IMPRESSION: Normal MRI of the brain.  No acute intracranial process identified. Electronically Signed   By: Rise Mu M.D.   On: 08/19/2016  01:53    ____________________________________________   PROCEDURES  Critical Care performed: No   Procedure(s) performed:   Procedures   ____________________________________________   INITIAL IMPRESSION / ASSESSMENT AND PLAN / ED COURSE  Pertinent labs & imaging results that were available during my care of the patient were reviewed by me and considered in my medical decision making (see chart for details).  He would be very unusual and unexpected to have the patient have a CVA with pure sensory symptoms.  He is otherwise neurologically intact.  He is very worried that he may be having a stroke, but he did admit he has a lot of preoccupations about his health.  His CT scan is reassuring.  He has no history of trauma and no headache and no neck pain that would suggest a cervical spine issue.  After discussing with the patient, we have decided to proceed with an MR brain without contrast to do our best to rule out an acute and potentially emergent cause of his acute onset paresthesia.  However he has a NIH stroke scale of one and  is outside the window for treatment so is not a TPA candidate.  However this will assist in determining if he needs admission for stroke risk stratification and additional medication management.    Clinical Course as of Aug 20 251  Fri Aug 19, 2016  0252 I gave the patient the reassuring results from his MRI.  He will follow up with his neurologist at the next available opportunity.  I gave my usual and customary return precautions.    MR Brain Wo Contrast [CF]    Clinical Course User Index [CF] Loleta Rose, MD    ____________________________________________  FINAL CLINICAL IMPRESSION(S) / ED DIAGNOSES  Final diagnoses:  Paresthesia     MEDICATIONS GIVEN DURING THIS VISIT:  Medications - No data to display   NEW OUTPATIENT MEDICATIONS STARTED DURING THIS VISIT:  New Prescriptions   No medications on file    Modified Medications   No  medications on file    Discontinued Medications   ISOSORBIDE MONONITRATE (IMDUR) 30 MG 24 HR TABLET    Take by mouth.     Note:  This document was prepared using Dragon voice recognition software and may include unintentional dictation errors.    Loleta Rose, MD 08/19/16 [phone]

## 2016-09-08 DIAGNOSIS — R202 Paresthesia of skin: Secondary | ICD-10-CM

## 2016-09-08 DIAGNOSIS — R200 Anesthesia of skin: Secondary | ICD-10-CM | POA: Insufficient documentation

## 2016-12-12 DIAGNOSIS — R519 Headache, unspecified: Secondary | ICD-10-CM | POA: Insufficient documentation

## 2016-12-12 DIAGNOSIS — R51 Headache: Secondary | ICD-10-CM

## 2017-01-23 ENCOUNTER — Other Ambulatory Visit: Payer: Self-pay

## 2017-01-23 ENCOUNTER — Emergency Department
Admission: EM | Admit: 2017-01-23 | Discharge: 2017-01-23 | Disposition: A | Discharge status: Home / Self Care | Payer: BLUE CROSS/BLUE SHIELD | Attending: Emergency Medicine | Admitting: Emergency Medicine

## 2017-01-23 ENCOUNTER — Encounter: Payer: Self-pay | Admitting: Emergency Medicine

## 2017-01-23 DIAGNOSIS — Z79899 Other long term (current) drug therapy: Secondary | ICD-10-CM | POA: Insufficient documentation

## 2017-01-23 DIAGNOSIS — I10 Essential (primary) hypertension: Secondary | ICD-10-CM | POA: Diagnosis not present

## 2017-01-23 NOTE — ED Provider Notes (Signed)
[facility] Emergency Department Provider Note  ____________________________________________  Time seen: Approximately 8:52 PM  I have reviewed the triage vital signs and the nursing notes.   HISTORY  Chief Complaint Hypertension  Interpreter was used  HPI Thomas Gaines is a 51 y.o. male who presents emergency department complaining of elevated blood pressure.  Patient reports that he went to his primary care today for testicular pain, was worked out with no findings.  Patient was hypertensive at that time and his blood pressure medication was doubled today.  Patient went home and checked his blood pressure and saw a reading of 210/110.  Patient presented to the emergency department after he was concerned that his blood pressure was up.  Patient had taken his corrected blood pressure medication dose prior to arrival.  Patient denies any headache, visual changes, chest pain, shortness of breath abdominal pain.  Patient still endorsing some mild testicular pain but declines any evaluation or workup on testicular pain at this time.  No other medication besides his blood pressure medication was taken this evening.  No other complaints.  While I was in the room talking with the patient, he demonstrated his portable blood pressure cuff.  I felt that reading was artificially high and mainly checked his blood pressure while in the room.  Patient's blood pressure machine was reading 20 mmHg high in both the systolic and diastolic readings.  This likely contributed to the extreme elevation reported.  In addition, patient appears extremely anxious and repeatedly worked himself up while discussing history with provider.  The highest reading I obtained in the emergency department was 148/82.  Past Medical History:  Diagnosis Date  . Hyperlipidemia   . Hyperlipidemia   . Hypertension     Patient Active Problem List   Diagnosis Date Noted  . Allergic rhinitis 01/22/2016   . Anxiety 01/22/2016  . Depression 01/22/2016  . GERD (gastroesophageal reflux disease) 01/22/2016  . Hyperlipidemia 01/22/2016  . Hypertension 01/22/2016  . Tonsillar hypertrophy 01/22/2016  . Essential hypertension 10/23/2015  . Heart palpitations 10/23/2015  . RBBB 10/23/2015  . SOB (shortness of breath) on exertion 10/23/2015  . OSA on CPAP 04/01/2015  . Restless legs 04/01/2015  . Sleep disorder 04/01/2015  . Iron disorder 11/06/2013  . OSA (obstructive sleep apnea) 11/06/2013  . 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 Allergic rhinitis due to pollen 06/17/2011  . Deviated nasal septum 05/18/2011  . Hypertrophy of tonsils 05/18/2011  . Obstructive sleep apnea 05/18/2011  . Disorder of upper respiratory system 05/18/2011    Past Surgical History:  Procedure Laterality Date  . CATARACT EXTRACTION      Prior to Admission medications   Medication Sig Start Date End Date Taking? Authorizing Provider  famotidine (PEPCID) 40 MG tablet Take 1 tablet (40 mg total) by mouth every evening. Patient not taking: Reported on 08/18/2016 05/11/16 05/11/17  Phineas SemenGoodman, Graydon, MD  gabapentin (NEURONTIN) 300 MG capsule Take 300 mg by mouth every evening.     [provider]  lisinopril (PRINIVIL,ZESTRIL) 10 MG tablet Take 10 mg by mouth daily.     [provider]  loratadine (CLARITIN) 10 MG tablet Take 1 tablet (10 mg total) by mouth daily. Patient not taking: Reported on 08/18/2016 05/17/16 05/17/17  Darci CurrentBrown, Vernonia N, MD  metoprolol succinate (TOPROL-XL) 100 MG 24 hr tablet Take 100 mg by mouth daily.     [provider]  simvastatin (ZOCOR) 20 MG tablet Take 20 mg by mouth every evening.  [provider]  sucralfate (CARAFATE) 1 g tablet Take 1 tablet (1 g total) by mouth 4 (four) times daily. Patient not taking: Reported on 08/18/2016 05/11/16   Phineas Semen, MD  sulfamethoxazole-trimethoprim (BACTRIM DS,SEPTRA DS) 800-160 MG tablet Take 1 tablet by mouth 2 (two)  times daily.    [provider]    Allergies Patient has no known allergies.  Family History  Problem Relation Age of Onset  . Prostate cancer Neg Hx   . Bladder Cancer Neg Hx   . Kidney cancer Neg Hx     Social History Social History   Tobacco Use  . Smoking status: Never Smoker  . Smokeless tobacco: Never Used  Substance Use Topics  . Alcohol use: No  . Drug use: No     Review of Systems  Constitutional: No fever/chills.  Positive for elevated blood pressure reading at home Eyes: No visual changes. No discharge ENT: No upper respiratory complaints. Cardiovascular: no chest pain. Respiratory: no cough. No SOB. Gastrointestinal: No abdominal pain.  No nausea, no vomiting.  No diarrhea.  No constipation. Genitourinary: Negative for dysuria. No hematuria.  Patient with testicular pain that is Artie been evaluated by primary care. Musculoskeletal: Negative for musculoskeletal pain. Skin: Negative for rash, abrasions, lacerations, ecchymosis. Neurological: Negative for headaches, focal weakness or numbness. 10-point ROS otherwise negative.  ____________________________________________   PHYSICAL EXAM:  VITAL SIGNS: ED Triage Vitals  Enc Vitals Group     BP 01/23/17 2037 (!) 131/58     Pulse Rate 01/23/17 2037 95     Resp 01/23/17 2037 20     Temp 01/23/17 2037 99.2 F (37.3 C)     Temp Source 01/23/17 2037 Oral     SpO2 01/23/17 2037 99 %     Weight 01/23/17 2039 190 lb (86.2 kg)     Height 01/23/17 2039 5\' 6"  (1.676 m)     Head Circumference --      Peak Flow --      Pain Score --      Pain Loc --      Pain Edu? --      Excl. in GC? --      Constitutional: Alert and oriented. Well appearing and in no acute distress. Eyes: Conjunctivae are normal. PERRL. EOMI. Head: Atraumatic. ENT:      Ears:       Nose: No congestion/rhinnorhea.      Mouth/Throat: Mucous membranes are moist.  Neck: No stridor.    Cardiovascular: Normal rate, regular  rhythm. Normal S1 and S2.  Good peripheral circulation. Respiratory: Normal respiratory effort without tachypnea or retractions. Lungs CTAB. Good air entry to the bases with no decreased or absent breath sounds. Gastrointestinal: Bowel sounds 4 quadrants. Soft and nontender to palpation. No guarding or rigidity. No palpable masses. No distention. No CVA tenderness. Musculoskeletal: Full range of motion to all extremities. No gross deformities appreciated. Neurologic:  Normal speech and language. No gross focal neurologic deficits are appreciated.  Skin:  Skin is warm, dry and intact. No rash noted. Psychiatric: Mood and affect are normal. Speech and behavior are normal. Patient exhibits appropriate insight and judgement.   ____________________________________________   LABS (all labs ordered are listed, but only abnormal results are displayed)  Labs Reviewed - No data to display ____________________________________________  EKG   ____________________________________________  RADIOLOGY   No results found.  ____________________________________________    PROCEDURES  Procedure(s) performed:    Procedures    Medications - No data  to display   ____________________________________________   INITIAL IMPRESSION / ASSESSMENT AND PLAN / ED COURSE  Pertinent labs & imaging results that were available during my care of the patient were reviewed by me and considered in my medical decision making (see chart for details).  Review of the [place] CSRS was performed in accordance of the [facility] prior to dispensing any controlled drugs.     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  Patient's blood pressure readings were within normal limits in the emergency department.  Patient's at home blood pressure medication does not appear to be well calibrated.  Patient is also extremely anxious as well as having a pain complaint.  This likely contributed to elevated blood  pressure reading.  Patient is to continue the medications as prescribed by his primary care and follow-up if there is any further concerns for hypertension.  At this time, no indication for labs or patient is completely symptom-free at this time..  Patient is given ED precautions to return to the ED for any worsening or new symptoms.     ____________________________________________  FINAL CLINICAL IMPRESSION(S) / ED DIAGNOSES  Final diagnoses:  None      NEW MEDICATIONS STARTED DURING THIS VISIT:  ED Discharge Orders    None          This chart was dictated using voice recognition software/Dragon. Despite best efforts to proofread, errors can occur which can change the meaning. Any change was purely unintentional.    Racheal PatchesCuthriell, Jonathan D, PA-C 01/23/17 2141    Sharyn CreamerQuale, Mark, MD 01/24/17 [phone]

## 2017-01-23 NOTE — ED Triage Notes (Signed)
States checked his pressure at home and got high reading. Denies pain.

## 2017-02-24 ENCOUNTER — Ambulatory Visit: Payer: BLUE CROSS/BLUE SHIELD | Admitting: Urology

## 2017-02-24 ENCOUNTER — Encounter: Payer: Self-pay | Admitting: Urology

## 2017-02-24 VITALS — BP 135/77 | HR 70 | Ht 66.0 in | Wt 195.0 lb

## 2017-02-24 DIAGNOSIS — N50819 Testicular pain, unspecified: Secondary | ICD-10-CM

## 2017-02-24 NOTE — Progress Notes (Signed)
02/24/2017 11:27 AM   Peterson LombardSergio Dirzo Hernandez [birth year] [id number]  Referring provider: Sandrea Hughsubio, Jessica, NP [address] [place], [place] [address]  No chief complaint on file.   HPI: The patient is a 52 year old gentleman who presents today for follow up of left testicular pain.  He presents today with the same complaints as previous.  He has pain that radiates to his left testicle.  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  He gets better with rest.  He was recently seen by Dr. [place] that told him he had a varicocele.  I reviewed the images on an x-ray film is brought with me.  He also has benign cyst.  This is very similar to his previous ultrasounds and is benign.  His pain is intermittent.  Background History: He has been treated with Bactrim with no relief. The pain is mainly located in his left testicle but also radiates to his penis. His main concern though is the pain in his left testicle. It is a dull squeezing pain. Has been present for 2 weeks. Do not have this pain prior. He has mild dysuria with urination as well. He has been on Bactrim which has not helped this persisted. His urine cultures as well as STD screening were negative. He has never had this pain before. He denies trauma to his groin. He has no issues of urinary tract infections. His urinalysis today is unremarkable. Exam was benign at first visit.  At previous visits he was started on ibuprofen and icing of his scrotum. He did not do this that often. He feels that he has a mass in his left testicle. When he touches that he has pain for 4-5 days. He is concerned by this but not able to reproduce or locate on repeat exam.   Scrotal ultrasound results was unremarkable with no masses. He does again note that his strenuous job does seem to exacerbate this pain. Advil has not helped with his discomfort.  He was recommended to start physical therapy, however he only went to one  treatment. He did not return as his pain dramatically improved after just one session. He last time had pain within his testicle approximately one month ago. He has had none since that time. Also in the interim, he has switched to a less strenuous job.     PMH: Past Medical History:  Diagnosis Date  . Hyperlipidemia   . Hyperlipidemia   . Hypertension     Surgical History: Past Surgical History:  Procedure Laterality Date  . CATARACT EXTRACTION      Home Medications:  Allergies as of 02/24/2017   No Known Allergies     Medication List        Accurate as of 02/24/17 11:27 AM. Always use your most recent med list.          famotidine 40 MG tablet Commonly known as:  PEPCID Take 1 tablet (40 mg total) by mouth every evening.   gabapentin 300 MG capsule Commonly known as:  NEURONTIN Take 300 mg by mouth every evening.   lisinopril 10 MG tablet Commonly known as:  PRINIVIL,ZESTRIL Take 10 mg by mouth daily.   loratadine 10 MG tablet Commonly known as:  CLARITIN Take 1 tablet (10 mg total) by mouth daily.   metoprolol succinate 100 MG 24 hr tablet Commonly known as:  TOPROL-XL Take 100 mg by mouth daily.   pregabalin 75 MG capsule Commonly  known as:  LYRICA Take 75 mg by mouth 2 (two) times daily.   simvastatin 20 MG tablet Commonly known as:  ZOCOR Take 20 mg by mouth every evening.   sucralfate 1 g tablet Commonly known as:  CARAFATE Take 1 tablet (1 g total) by mouth 4 (four) times daily.   sulfamethoxazole-trimethoprim 800-160 MG tablet Commonly known as:  BACTRIM DS,SEPTRA DS Take 1 tablet by mouth 2 (two) times daily.       Allergies: No Known Allergies  Family History: Family History  Problem Relation Age of Onset  . Prostate cancer Neg Hx   . Bladder Cancer Neg Hx   . Kidney cancer Neg Hx     Social History:  reports that  has never smoked. he has never used smokeless tobacco. He reports that he does not drink alcohol or use  drugs.  ROS: UROLOGY Frequent Urination?: No Hard to postpone urination?: Yes Burning/pain with urination?: No Get up at night to urinate?: No Leakage of urine?: Yes Urine stream starts and stops?: No Trouble starting stream?: Yes Do you have to strain to urinate?: No Blood in urine?: No Urinary tract infection?: No Sexually transmitted disease?: Yes Injury to kidneys or bladder?: No Painful intercourse?: No Weak stream?: Yes Erection problems?: No Penile pain?: No  Gastrointestinal Nausea?: No Vomiting?: No Indigestion/heartburn?: No Diarrhea?: Yes Constipation?: No  Constitutional Fever: No Night sweats?: No Weight loss?: No Fatigue?: No  Skin Skin rash/lesions?: No Itching?: Yes  Eyes Blurred vision?: No Double vision?: No  Ears/Nose/Throat Sore throat?: No Sinus problems?: Yes  Hematologic/Lymphatic Swollen glands?: No Easy bruising?: No  Cardiovascular Leg swelling?: No Chest pain?: No  Respiratory Cough?: No Shortness of breath?: No  Endocrine Excessive thirst?: No  Musculoskeletal Back pain?: No Joint pain?: No  Neurological Headaches?: No Dizziness?: No  Psychologic Depression?: Yes Anxiety?: Yes  Physical Exam: BP 135/77   Pulse 70   Ht 5\' 6"  (1.676 m)   Wt 195 lb (88.5 kg)   BMI 31.47 kg/m   Constitutional:  Alert and oriented, No acute distress. HEENT: [place] AT, moist mucus membranes.  Trachea midline, no masses. Cardiovascular: No clubbing, cyanosis, or edema. Respiratory: Normal respiratory effort, no increased work of breathing. GI: Abdomen is soft, nontender, nondistended, no abdominal masses GU: No CVA tenderness.  Skin: No rashes, bruises or suspicious lesions. Lymph: No cervical or inguinal adenopathy. Neurologic: Grossly intact, no focal deficits, moving all 4 extremities. Psychiatric: Normal mood and affect.  Laboratory Data: Lab Results  Component Value Date   WBC 10.0 08/18/2016   HGB 14.7 08/18/2016    HCT 42.6 08/18/2016   MCV 90.1 08/18/2016   PLT 224 08/18/2016    Lab Results  Component Value Date   CREATININE 0.88 08/18/2016    No results found for: PSA  No results found for: TESTOSTERONE  No results found for: HGBA1C  Urinalysis    Component Value Date/Time   COLORURINE Colorless 10/31/2013 0412   APPEARANCEUR Clear 01/22/2016 1005   LABSPEC 1.002 10/31/2013 0412   PHURINE 6.0 10/31/2013 0412   GLUCOSEU Negative 01/22/2016 1005   GLUCOSEU Negative 10/31/2013 0412   HGBUR 1+ 10/31/2013 0412   BILIRUBINUR Negative 01/22/2016 1005   BILIRUBINUR Negative 10/31/2013 0412   KETONESUR Negative 10/31/2013 0412   PROTEINUR Negative 01/22/2016 1005   PROTEINUR Negative 10/31/2013 0412   NITRITE Negative 01/22/2016 1005   NITRITE Negative 10/31/2013 0412   LEUKOCYTESUR Negative 01/22/2016 1005   LEUKOCYTESUR Negative 10/31/2013 0412  Assessment & Plan:    1.  Left testicular pain/pelvic floor pain I discussed again with the patient has a hat on multiple other occasions that his pain is musculoskeletal in origin.  We have ruled out any serious other sources.  His symptomatology when it worsens during heavy work also fits this.  I did discuss with him that the only real solution for this is pelvic floor physical therapy.  I will arrange for him to return to this treatment regimen.  He previously only underwent this once per   No Follow-up on file.  Hildred Laser, MD  [facility] [address], [address] [place], [place] [address] [phone]

## 2017-03-13 ENCOUNTER — Ambulatory Visit: Payer: BLUE CROSS/BLUE SHIELD | Admitting: Physical Therapy

## 2017-05-05 ENCOUNTER — Ambulatory Visit: Payer: BLUE CROSS/BLUE SHIELD | Attending: Urology | Admitting: Physical Therapy

## 2017-07-11 DIAGNOSIS — M6289 Other specified disorders of muscle: Secondary | ICD-10-CM | POA: Insufficient documentation

## 2017-07-11 DIAGNOSIS — N9489 Other specified conditions associated with female genital organs and menstrual cycle: Secondary | ICD-10-CM | POA: Insufficient documentation

## 2017-07-11 DIAGNOSIS — K6289 Other specified diseases of anus and rectum: Secondary | ICD-10-CM | POA: Insufficient documentation

## 2017-07-17 DIAGNOSIS — R208 Other disturbances of skin sensation: Secondary | ICD-10-CM | POA: Insufficient documentation

## 2017-08-07 NOTE — Progress Notes (Signed)
Patient seen on 09/06/2017.

## 2017-08-08 ENCOUNTER — Ambulatory Visit (INDEPENDENT_AMBULATORY_CARE_PROVIDER_SITE_OTHER): Payer: BLUE CROSS/BLUE SHIELD | Admitting: Urology

## 2017-08-08 ENCOUNTER — Encounter: Payer: Self-pay | Admitting: Urology

## 2017-08-08 VITALS — BP 100/62 | HR 71 | Resp 16 | Ht 66.5 in | Wt 199.3 lb

## 2017-08-08 DIAGNOSIS — R3129 Other microscopic hematuria: Secondary | ICD-10-CM

## 2017-08-08 LAB — URINALYSIS, COMPLETE
BILIRUBIN UA: NEGATIVE
Glucose, UA: NEGATIVE
Ketones, UA: NEGATIVE
Leukocytes, UA: NEGATIVE
NITRITE UA: NEGATIVE
PH UA: 5 (ref 5.0–7.5)
PROTEIN UA: NEGATIVE
Specific Gravity, UA: >1.03 — ABNORMAL HIGH (ref 1.005–1.030)
UUROB: 0.2 mg/dL (ref 0.2–1.0)

## 2017-08-08 LAB — MICROSCOPIC EXAMINATION
Epithelial Cells (non renal): NONE SEEN /hpf (ref 0–10)
WBC UA: NONE SEEN /HPF (ref 0–5)

## 2017-08-09 LAB — BUN+CREAT
BUN/Creatinine Ratio: 15 (ref 9–20)
BUN: 17 mg/dL (ref 6–24)
CREATININE: 1.15 mg/dL (ref 0.76–1.27)
GFR calc Af Amer: 85 mL/min/{1.73_m2} (ref >59)
GFR calc non Af Amer: 73 mL/min/{1.73_m2} (ref >59)

## 2017-08-11 LAB — CULTURE, URINE COMPREHENSIVE

## 2017-09-04 NOTE — Progress Notes (Signed)
09/06/2017 8:38 AM   Thomas Gaines 08-25-1965 [id number]  Referring provider: Sandrea Hughs, NP [address] [place], [place] [address]  Chief Complaint  Patient presents with  . Hematuria    HPI: Patient is a 51 -year-old Hispanic male who presents today as a referral from Sandrea Hughs, NP for microscopic hematuria with interpreter, Maryjane Hurter.    Patient was found to have microscopic hematuria on 07/13/2017 with 10-50 RBC's/hpf.  Urine culture was negative.  Patient  does have a prior history of microscopic hematuria.  He states he has been told for the last several years while he was a patient at the [facility] that he had blood in his urine.    He does not have a prior history of recurrent urinary tract infections, nephrolithiasis, trauma to the genitourinary tract, BPH or malignancies of the genitourinary tract.   He does not have a family medical history of nephrolithiasis, malignancies of the genitourinary tract or hematuria.   Today, he is having symptoms of frequency (that is intermittent), urgency, intermittency, straining to urinate, weak urinary stream, dysuria happens at the beginning of the stream and nocturia for two years.  Patient denies any gross hematuria, dysuria or suprapubic/flank pain.  Patient denies any fevers, chills, nausea or vomiting. His UA today demonstrates 0-2 RBC's.   He is not a smoker.   He is not exposed to secondhand smoke.  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   He has HTN.   He has a high BMI.    He mentioned that over the last four months he is having little cuts on his penis around the coronal ridge.  Foreskin did not have adhesions and was easily retracted during the exam.  No cuts were seen.     PMH: Past Medical History:  Diagnosis Date  . Hyperlipidemia   . Hyperlipidemia   . Hypertension     Surgical History: Past Surgical History:  Procedure Laterality Date  . CATARACT  EXTRACTION      Home Medications:  Allergies as of 09/06/2017   No Known Allergies     Medication List        Accurate as of 09/06/17  8:38 AM. Always use your most recent med list.          gabapentin 300 MG capsule Commonly known as:  NEURONTIN Take 300 mg by mouth every evening.   lisinopril 10 MG tablet Commonly known as:  PRINIVIL,ZESTRIL Take 10 mg by mouth daily.   metoprolol succinate 100 MG 24 hr tablet Commonly known as:  TOPROL-XL Take 100 mg by mouth daily.   pregabalin 75 MG capsule Commonly known as:  LYRICA Take 75 mg by mouth 2 (two) times daily.   simvastatin 20 MG tablet Commonly known as:  ZOCOR Take 20 mg by mouth every evening.       Allergies: No Known Allergies  Family History: Family History  Problem Relation Age of Onset  . Prostate cancer Neg Hx   . Bladder Cancer Neg Hx   . Kidney cancer Neg Hx     Social History:  reports that he has never smoked. He has never used smokeless tobacco. He reports that he does not drink alcohol or use drugs.  ROS: UROLOGY Frequent Urination?: No Hard to postpone urination?: No Burning/pain with urination?: Yes Get up at night to urinate?: No Leakage of urine?: No Urine stream starts and stops?: Yes Trouble starting stream?: No Do you  have to strain to urinate?: Yes Blood in urine?: Yes Urinary tract infection?: No Sexually transmitted disease?: No Injury to kidneys or bladder?: No Painful intercourse?: No Weak stream?: Yes Erection problems?: No Penile pain?: Yes  Gastrointestinal Nausea?: No Vomiting?: No Indigestion/heartburn?: No Diarrhea?: No Constipation?: No  Constitutional Fever: No Night sweats?: No Weight loss?: No Fatigue?: No  Skin Skin rash/lesions?: No Itching?: No  Eyes Blurred vision?: Yes Double vision?: No  Ears/Nose/Throat Sore throat?: No Sinus problems?: Yes  Hematologic/Lymphatic Swollen glands?: No Easy bruising?: No  Cardiovascular Leg  swelling?: No Chest pain?: No  Respiratory Cough?: No Shortness of breath?: No  Endocrine Excessive thirst?: No  Musculoskeletal Back pain?: No Joint pain?: No  Neurological Headaches?: No Dizziness?: No  Psychologic Depression?: No Anxiety?: No  Physical Exam: BP (!) 147/93 (BP Location: Left Arm, Patient Position: Sitting, Cuff Size: Normal)   Pulse 69   Ht 5' 6.5" (1.689 m)   Wt 204 lb 12.8 oz (92.9 kg)   BMI 32.56 kg/m   Constitutional:  Well nourished. Alert and oriented, No acute distress. HEENT: [place] AT, moist mucus membranes.  Trachea midline, no masses. Cardiovascular: No clubbing, cyanosis, or edema. Respiratory: Normal respiratory effort, no increased work of breathing. GI: Abdomen is soft, non tender, non distended, no abdominal masses. Liver and spleen not palpable.  No hernias appreciated.  Stool sample for occult testing is not indicated.   GU: No CVA tenderness.  No bladder fullness or masses.  Patient with uncircumcised phallus. Foreskin easily retracted Urethral meatus is patent.  No penile discharge. No penile lesions or rashes. Scrotum without lesions, cysts, rashes and/or edema.  Testicles are located scrotally bilaterally. No masses are appreciated in the testicles. Left and right epididymis are normal. Rectal: Patient with  normal sphincter tone. Anus and perineum without scarring or rashes. No rectal masses are appreciated. Prostate is approximately 50 grams, could only palpated the apex, no nodules are appreciated.  Skin: No rashes, bruises or suspicious lesions. Lymph: No cervical or inguinal adenopathy. Neurologic: Grossly intact, no focal deficits, moving all 4 extremities. Psychiatric: Normal mood and affect.  Laboratory Data: Lab Results  Component Value Date   WBC 10.0 08/18/2016   HGB 14.7 08/18/2016   HCT 42.6 08/18/2016   MCV 90.1 08/18/2016   PLT 224 08/18/2016    Lab Results  Component Value Date   CREATININE 1.15 08/08/2017     No results found for: PSA  No results found for: TESTOSTERONE  No results found for: HGBA1C  No results found for: TSH  No results found for: CHOL, HDL, CHOLHDL, VLDL, LDLCALC  Lab Results  Component Value Date   AST 26 08/18/2016   Lab Results  Component Value Date   ALT 34 08/18/2016   No components found for: ALKALINEPHOPHATASE No components found for: BILIRUBINTOTAL  No results found for: ESTRADIOL   Urinalysis See Epic and HPI.    I have reviewed the labs.  Pertinent Imaging: Non contrast CT in 10/2013 was negative Scrotal ultrasound in 03/2016 was negative  Assessment & Plan:    1. Microscopic hematuria Explained to the patient that there are a number of causes that can be associated with blood in the urine, such as stones, BPH, UTI's, damage to the urinary tract and/or cancer. At this time, I felt that the patient warranted further urologic evaluation.   The AUA guidelines state that a CT urogram is the preferred imaging study to evaluate hematuria. I explained to the patient that a  contrast material will be injected into a vein and that in rare instances, an allergic reaction can result and may even life threatening   The patient denies any allergies to contrast, iodine and/or seafood and is not taking metformin. Following the imaging study,  I've recommended a cystoscopy. I described how this is performed, typically in an office setting with a flexible cystoscope. We described the risks, benefits, and possible side effects, the most common of which is a minor amount of blood in the urine and/or burning which usually resolves in 24 to 48 hours.   The patient had the opportunity to ask questions which were answered. Based upon this discussion, the patient is willing to proceed. Therefore, I've ordered: a CT Urogram and cystoscopy.   - The patient will return following all of the above for discussion of the results.   - UA   - Urine culture   - BUN + creatinine     Return for CT Urogram report and cystoscopy.  These notes generated with voice recognition software. I apologize for typographical errors.  Michiel CowboySHANNON Reigan Tolliver, PA-C  [facility] [address]  [place], [place] [address] [phone]

## 2017-09-06 ENCOUNTER — Encounter: Payer: Self-pay | Admitting: Urology

## 2017-09-06 ENCOUNTER — Ambulatory Visit (INDEPENDENT_AMBULATORY_CARE_PROVIDER_SITE_OTHER): Payer: BLUE CROSS/BLUE SHIELD | Admitting: Urology

## 2017-09-06 VITALS — BP 147/93 | HR 69 | Ht 66.5 in | Wt 204.8 lb

## 2017-09-06 DIAGNOSIS — R3129 Other microscopic hematuria: Secondary | ICD-10-CM | POA: Diagnosis not present

## 2017-09-07 LAB — BUN+CREAT
BUN/Creatinine Ratio: 18 (ref 9–20)
BUN: 17 mg/dL (ref 6–24)
Creatinine, Ser: 0.95 mg/dL (ref 0.76–1.27)
GFR calc Af Amer: 107 mL/min/{1.73_m2} (ref >59)
GFR calc non Af Amer: 92 mL/min/{1.73_m2} (ref >59)

## 2017-09-09 LAB — CULTURE, URINE COMPREHENSIVE

## 2017-10-06 ENCOUNTER — Ambulatory Visit
Admission: RE | Admit: 2017-10-06 | Discharge: 2017-10-06 | Disposition: A | Discharge status: Home / Self Care | Payer: BLUE CROSS/BLUE SHIELD | Source: Ambulatory Visit | Attending: Urology | Admitting: Urology

## 2017-10-06 DIAGNOSIS — R3129 Other microscopic hematuria: Secondary | ICD-10-CM

## 2017-10-06 MED ORDER — IOPAMIDOL (ISOVUE-300) INJECTION 61%
125.0000 mL | Freq: Once | INTRAVENOUS | Status: AC | PRN
  Administered 2017-10-06: 125 mL via INTRAVENOUS

## 2017-10-16 ENCOUNTER — Ambulatory Visit: Payer: BLUE CROSS/BLUE SHIELD | Admitting: Urology

## 2017-10-16 ENCOUNTER — Encounter: Payer: Self-pay | Admitting: Urology

## 2017-10-16 VITALS — BP 156/81 | HR 85 | Ht 66.5 in | Wt 200.6 lb

## 2017-10-16 DIAGNOSIS — R3129 Other microscopic hematuria: Secondary | ICD-10-CM | POA: Diagnosis not present

## 2017-10-16 MED ORDER — TAMSULOSIN HCL 0.4 MG PO CAPS: 0.4000 mg | ORAL_CAPSULE | Freq: Every day | ORAL | 3 refills | Status: DC

## 2017-10-16 MED ORDER — LIDOCAINE HCL URETHRAL/MUCOSAL 2 % EX GEL: 1.0000 "application " | Freq: Once | CUTANEOUS | Status: AC

## 2017-10-16 NOTE — Progress Notes (Signed)
   10/16/17  CC:  Chief Complaint  Patient presents with  . Cysto    HPI: 52 year old male who who saw Michiel Cowboy on 09/06/2017 for microhematuria.  He also has bothersome lower urinary tract symptoms including frequency, urgency, intermittency, straining to urinate, weak stream and dysuria at the beginning of his urinary stream.  CT urogram performed on 10/06/2017 showed no upper tract abnormalities.  Blood pressure (!) 156/81, pulse 85, height 5' 6.5" (1.689 m), weight 200 lb 9.6 oz (91 kg). NED. A&Ox3.     Cystoscopy Procedure Note  Patient identification was confirmed, informed consent was obtained, and patient was prepped using Betadine solution.  Lidocaine jelly was administered per urethral meatus.    Preoperative abx where received prior to procedure.     Pre-Procedure: - Inspection reveals a normal caliber ureteral meatus.  Procedure: The flexible cystoscope was introduced without difficulty - No urethral strictures/lesions are present. - Mild lateral lobe enlargement prostate with prominent hypervascularity and friability - Mild bladder neck elevation - Bilateral ureteral orifices identified - Bladder mucosa  reveals no ulcers, tumors, or lesions - No bladder stones - No trabeculation  Retroflexion shows no abnormalities   Post-Procedure: - Patient tolerated the procedure well  Assessment/ Plan: Microhematuria most likely prostatic in origin.  No upper tract abnormalities on CTU or evidence of bladder tumor.  Findings were discussed via an interpreter.  Rx tamsulosin sent to pharmacy.  Follow-up with Carollee Herter in approximately 6 weeks for symptom reassessment.

## 2017-10-17 LAB — URINALYSIS, COMPLETE
BILIRUBIN UA: NEGATIVE
GLUCOSE, UA: NEGATIVE
KETONES UA: NEGATIVE
Leukocytes, UA: NEGATIVE
Nitrite, UA: NEGATIVE
PROTEIN UA: NEGATIVE
SPEC GRAV UA: 1.02 (ref 1.005–1.030)
UUROB: 0.2 mg/dL (ref 0.2–1.0)
pH, UA: 6 (ref 5.0–7.5)

## 2017-10-18 ENCOUNTER — Telehealth: Payer: Self-pay | Admitting: Urology

## 2017-10-18 MED ORDER — TAMSULOSIN HCL 0.4 MG PO CAPS: 0.4000 mg | ORAL_CAPSULE | Freq: Every day | ORAL | 3 refills | Status: DC

## 2017-10-18 NOTE — Telephone Encounter (Signed)
Pt came in to office stating Flomax was not at pharmacy when they went to pick it up.  [facility] on [address] [employer].

## 2017-10-18 NOTE — Addendum Note (Signed)
Addended by: Vickki Hearing on: 10/18/2017 04:48 PM   Modules accepted: Orders

## 2017-10-18 NOTE — Telephone Encounter (Signed)
Sent to pharmacy 

## 2017-10-27 ENCOUNTER — Emergency Department: Payer: BLUE CROSS/BLUE SHIELD | Admitting: Anesthesiology

## 2017-10-27 ENCOUNTER — Ambulatory Visit
Admission: EM | Admit: 2017-10-27 | Discharge: 2017-10-27 | Disposition: A | Discharge status: Home / Self Care | Payer: BLUE CROSS/BLUE SHIELD | Attending: Emergency Medicine | Admitting: Emergency Medicine

## 2017-10-27 ENCOUNTER — Encounter
Admission: EM | Disposition: A | Discharge status: Home / Self Care | Payer: Self-pay | Source: Home / Self Care | Attending: Emergency Medicine

## 2017-10-27 ENCOUNTER — Encounter: Payer: Self-pay | Admitting: Urology

## 2017-10-27 ENCOUNTER — Other Ambulatory Visit: Payer: Self-pay

## 2017-10-27 DIAGNOSIS — Y92003 Bedroom of unspecified non-institutional (private) residence as the place of occurrence of the external cause: Secondary | ICD-10-CM | POA: Insufficient documentation

## 2017-10-27 DIAGNOSIS — X58XXXA Exposure to other specified factors, initial encounter: Secondary | ICD-10-CM | POA: Diagnosis not present

## 2017-10-27 DIAGNOSIS — E785 Hyperlipidemia, unspecified: Secondary | ICD-10-CM | POA: Insufficient documentation

## 2017-10-27 DIAGNOSIS — N4889 Other specified disorders of penis: Secondary | ICD-10-CM | POA: Insufficient documentation

## 2017-10-27 DIAGNOSIS — S39840A Fracture of corpus cavernosum penis, initial encounter: Secondary | ICD-10-CM | POA: Diagnosis not present

## 2017-10-27 DIAGNOSIS — F419 Anxiety disorder, unspecified: Secondary | ICD-10-CM | POA: Diagnosis not present

## 2017-10-27 DIAGNOSIS — K219 Gastro-esophageal reflux disease without esophagitis: Secondary | ICD-10-CM | POA: Diagnosis not present

## 2017-10-27 DIAGNOSIS — Z79899 Other long term (current) drug therapy: Secondary | ICD-10-CM | POA: Insufficient documentation

## 2017-10-27 DIAGNOSIS — N486 Induration penis plastica: Secondary | ICD-10-CM | POA: Insufficient documentation

## 2017-10-27 DIAGNOSIS — I10 Essential (primary) hypertension: Secondary | ICD-10-CM | POA: Diagnosis not present

## 2017-10-27 DIAGNOSIS — G4733 Obstructive sleep apnea (adult) (pediatric): Secondary | ICD-10-CM | POA: Diagnosis not present

## 2017-10-27 DIAGNOSIS — F329 Major depressive disorder, single episode, unspecified: Secondary | ICD-10-CM | POA: Diagnosis not present

## 2017-10-27 HISTORY — PX: CIRCUMCISION: SHX1350

## 2017-10-27 LAB — URINALYSIS, COMPLETE (UACMP) WITH MICROSCOPIC
BACTERIA UA: NONE SEEN
Bilirubin Urine: NEGATIVE
Glucose, UA: NEGATIVE mg/dL
Ketones, ur: NEGATIVE mg/dL
Leukocytes, UA: NEGATIVE
NITRITE: NEGATIVE
PH: 7 (ref 5.0–8.0)
Protein, ur: NEGATIVE mg/dL
SPECIFIC GRAVITY, URINE: 1.013 (ref 1.005–1.030)
SQUAMOUS EPITHELIAL / LPF: NONE SEEN (ref 0–5)

## 2017-10-27 LAB — GLUCOSE, CAPILLARY: Glucose-Capillary: 126 mg/dL — ABNORMAL HIGH (ref 70–99)

## 2017-10-27 SURGERY — CIRCUMCISION, ADULT
Anesthesia: General | Site: Penis

## 2017-10-27 MED ORDER — BUPIVACAINE HCL (PF) 0.25 % IJ SOLN
INTRAMUSCULAR | Status: AC
  Filled 2017-10-27: qty 30

## 2017-10-27 MED ORDER — PROPOFOL 10 MG/ML IV BOLUS
INTRAVENOUS | Status: AC
  Filled 2017-10-27: qty 20

## 2017-10-27 MED ORDER — OXYCODONE-ACETAMINOPHEN 5-325 MG PO TABS
1.0000 | ORAL_TABLET | ORAL | Status: DC | PRN
  Administered 2017-10-27 (×2): 2 via ORAL
  Filled 2017-10-27 (×2): qty 2

## 2017-10-27 MED ORDER — EPHEDRINE SULFATE 50 MG/ML IJ SOLN
INTRAMUSCULAR | Status: DC | PRN
  Administered 2017-10-27 (×3): 10 mg via INTRAVENOUS
  Administered 2017-10-27: 5 mg via INTRAVENOUS

## 2017-10-27 MED ORDER — DEXAMETHASONE SODIUM PHOSPHATE 10 MG/ML IJ SOLN
INTRAMUSCULAR | Status: DC | PRN
  Administered 2017-10-27: 10 mg via INTRAVENOUS

## 2017-10-27 MED ORDER — DEXAMETHASONE SODIUM PHOSPHATE 10 MG/ML IJ SOLN
INTRAMUSCULAR | Status: AC
  Filled 2017-10-27: qty 1

## 2017-10-27 MED ORDER — LIDOCAINE HCL (CARDIAC) PF 100 MG/5ML IV SOSY
PREFILLED_SYRINGE | INTRAVENOUS | Status: DC | PRN
  Administered 2017-10-27: 100 mg via INTRAVENOUS

## 2017-10-27 MED ORDER — METOPROLOL SUCCINATE ER 100 MG PO TB24
100.0000 mg | ORAL_TABLET | Freq: Every day | ORAL | Status: DC
  Administered 2017-10-27: 100 mg via ORAL
  Filled 2017-10-27: qty 1

## 2017-10-27 MED ORDER — LISINOPRIL 10 MG PO TABS
10.0000 mg | ORAL_TABLET | Freq: Every day | ORAL | Status: DC
  Administered 2017-10-27: 10 mg via ORAL
  Filled 2017-10-27: qty 1

## 2017-10-27 MED ORDER — PREGABALIN 75 MG PO CAPS
75.0000 mg | ORAL_CAPSULE | Freq: Two times a day (BID) | ORAL | Status: DC
  Filled 2017-10-27: qty 1

## 2017-10-27 MED ORDER — GABAPENTIN 300 MG PO CAPS: 300.0000 mg | ORAL_CAPSULE | Freq: Every evening | ORAL | Status: DC

## 2017-10-27 MED ORDER — SULFAMETHOXAZOLE-TRIMETHOPRIM 800-160 MG PO TABS: 1.0000 | ORAL_TABLET | Freq: Two times a day (BID) | ORAL | 0 refills | Status: DC

## 2017-10-27 MED ORDER — BUPIVACAINE HCL 0.25 % IJ SOLN
INTRAMUSCULAR | Status: DC | PRN
  Administered 2017-10-27: 4 mL

## 2017-10-27 MED ORDER — MORPHINE SULFATE (PF) 2 MG/ML IV SOLN
2.0000 mg | INTRAVENOUS | Status: DC | PRN
  Administered 2017-10-27: 2 mg via INTRAVENOUS
  Filled 2017-10-27: qty 1

## 2017-10-27 MED ORDER — MIDAZOLAM HCL 2 MG/2ML IJ SOLN
INTRAMUSCULAR | Status: AC
  Filled 2017-10-27: qty 2

## 2017-10-27 MED ORDER — OXYMETAZOLINE HCL 0.05 % NA SOLN
NASAL | Status: AC
  Administered 2017-10-27: 02:00:00
  Filled 2017-10-27: qty 15

## 2017-10-27 MED ORDER — FENTANYL CITRATE (PF) 100 MCG/2ML IJ SOLN
INTRAMUSCULAR | Status: AC
  Administered 2017-10-27: 25 ug via INTRAVENOUS
  Filled 2017-10-27: qty 2

## 2017-10-27 MED ORDER — CEFAZOLIN (ANCEF) 1 G IV SOLR
2.0000 g | INTRAVENOUS | Status: AC
  Administered 2017-10-27: 2 g

## 2017-10-27 MED ORDER — FENTANYL CITRATE (PF) 100 MCG/2ML IJ SOLN
25.0000 ug | INTRAMUSCULAR | Status: AC | PRN
  Administered 2017-10-27 (×6): 25 ug via INTRAVENOUS

## 2017-10-27 MED ORDER — OXYCODONE-ACETAMINOPHEN 5-325 MG PO TABS: 1.0000 | ORAL_TABLET | Freq: Four times a day (QID) | ORAL | 0 refills | Status: DC | PRN

## 2017-10-27 MED ORDER — ONDANSETRON HCL 4 MG/2ML IJ SOLN: 4.0000 mg | Freq: Once | INTRAMUSCULAR | Status: DC | PRN

## 2017-10-27 MED ORDER — FENTANYL CITRATE (PF) 100 MCG/2ML IJ SOLN
INTRAMUSCULAR | Status: DC | PRN
  Administered 2017-10-27: 50 ug via INTRAVENOUS

## 2017-10-27 MED ORDER — BACITRACIN ZINC 500 UNIT/GM EX OINT
TOPICAL_OINTMENT | CUTANEOUS | Status: AC
  Filled 2017-10-27: qty 28.35

## 2017-10-27 MED ORDER — ONDANSETRON HCL 4 MG/2ML IJ SOLN
INTRAMUSCULAR | Status: AC
  Filled 2017-10-27: qty 2

## 2017-10-27 MED ORDER — TAMSULOSIN HCL 0.4 MG PO CAPS
0.4000 mg | ORAL_CAPSULE | Freq: Every day | ORAL | Status: DC
  Administered 2017-10-27: 0.4 mg via ORAL
  Filled 2017-10-27: qty 1

## 2017-10-27 MED ORDER — SUCCINYLCHOLINE CHLORIDE 20 MG/ML IJ SOLN
INTRAMUSCULAR | Status: AC
  Filled 2017-10-27: qty 1

## 2017-10-27 MED ORDER — CEFAZOLIN SODIUM-DEXTROSE 1-4 GM/50ML-% IV SOLN
1.0000 g | Freq: Once | INTRAVENOUS | Status: AC
  Administered 2017-10-27: 1 g via INTRAVENOUS
  Filled 2017-10-27: qty 50

## 2017-10-27 MED ORDER — HYDROCODONE-ACETAMINOPHEN 5-325 MG PO TABS
1.0000 | ORAL_TABLET | Freq: Once | ORAL | Status: AC
  Administered 2017-10-27: 1 via ORAL
  Filled 2017-10-27: qty 1

## 2017-10-27 MED ORDER — SUCCINYLCHOLINE CHLORIDE 20 MG/ML IJ SOLN
INTRAMUSCULAR | Status: DC | PRN
  Administered 2017-10-27: 120 mg via INTRAVENOUS

## 2017-10-27 MED ORDER — SIMVASTATIN 20 MG PO TABS
20.0000 mg | ORAL_TABLET | Freq: Every evening | ORAL | Status: DC
  Filled 2017-10-27: qty 1

## 2017-10-27 MED ORDER — ACETAMINOPHEN 10 MG/ML IV SOLN
INTRAVENOUS | Status: AC
  Filled 2017-10-27: qty 100

## 2017-10-27 MED ORDER — ACETAMINOPHEN 10 MG/ML IV SOLN
INTRAVENOUS | Status: DC | PRN
  Administered 2017-10-27: 1000 mg via INTRAVENOUS

## 2017-10-27 MED ORDER — ONDANSETRON HCL 4 MG/2ML IJ SOLN
INTRAMUSCULAR | Status: DC | PRN
  Administered 2017-10-27: 4 mg via INTRAVENOUS

## 2017-10-27 MED ORDER — CEFAZOLIN SODIUM 1 G IJ SOLR
INTRAMUSCULAR | Status: AC
  Filled 2017-10-27: qty 20

## 2017-10-27 MED ORDER — BUPIVACAINE HCL (PF) 0.5 % IJ SOLN
30.0000 mL | Freq: Once | INTRAMUSCULAR | Status: AC
  Administered 2017-10-27: 30 mL
  Filled 2017-10-27: qty 30

## 2017-10-27 MED ORDER — LACTATED RINGERS IV SOLN
INTRAVENOUS | Status: DC | PRN
  Administered 2017-10-27: 04:00:00 via INTRAVENOUS

## 2017-10-27 MED ORDER — PROPOFOL 10 MG/ML IV BOLUS
INTRAVENOUS | Status: DC | PRN
  Administered 2017-10-27: 200 mg via INTRAVENOUS

## 2017-10-27 MED ORDER — FENTANYL CITRATE (PF) 100 MCG/2ML IJ SOLN
INTRAMUSCULAR | Status: AC
  Filled 2017-10-27: qty 2

## 2017-10-27 MED ORDER — MIDAZOLAM HCL 2 MG/2ML IJ SOLN
INTRAMUSCULAR | Status: DC | PRN
  Administered 2017-10-27: 2 mg via INTRAVENOUS

## 2017-10-27 SURGICAL SUPPLY — 37 items
BAG URINE DRAINAGE (UROLOGICAL SUPPLIES) ×2 IMPLANT
BLADE CLIPPER SURG (BLADE) IMPLANT
BLADE SURG 15 STRL LF DISP TIS (BLADE) ×1 IMPLANT
BLADE SURG 15 STRL SS (BLADE) ×1
BNDG COHESIVE 1X5 TAN NS LF (GAUZE/BANDAGES/DRESSINGS) IMPLANT
BNDG CONFORM 2 STRL LF (GAUZE/BANDAGES/DRESSINGS) ×2 IMPLANT
CANISTER SUCT 1200ML W/VALVE (MISCELLANEOUS) ×2 IMPLANT
CATH FOLEY 2WAY  5CC 16FR (CATHETERS) ×1
CATH URTH 16FR FL 2W BLN LF (CATHETERS) ×1 IMPLANT
CHLORAPREP W/TINT 26ML (MISCELLANEOUS) ×2 IMPLANT
DRAPE LAPAROTOMY 77X122 PED (DRAPES) ×2 IMPLANT
ELECT REM PT RETURN 9FT ADLT (ELECTROSURGICAL) ×2
ELECTRODE REM PT RTRN 9FT ADLT (ELECTROSURGICAL) ×1 IMPLANT
GAUZE PETRO XEROFOAM 1X8 (MISCELLANEOUS) ×2 IMPLANT
GAUZE PETROLATUM 1 X8 (GAUZE/BANDAGES/DRESSINGS) IMPLANT
GAUZE STRETCH 2X75IN STRL (MISCELLANEOUS) ×2 IMPLANT
GAUZE XEROFORM 4X4 STRL (GAUZE/BANDAGES/DRESSINGS) ×2 IMPLANT
GLOVE BIO SURGEON STRL SZ8 (GLOVE) ×8 IMPLANT
GOWN STRL REUS W/ TWL LRG LVL3 (GOWN DISPOSABLE) ×2 IMPLANT
GOWN STRL REUS W/TWL LRG LVL3 (GOWN DISPOSABLE) ×2
KIT TURNOVER KIT A (KITS) ×2 IMPLANT
LABEL OR SOLS (LABEL) IMPLANT
NEEDLE HYPO 25X1 1.5 SAFETY (NEEDLE) ×2 IMPLANT
NS IRRIG 500ML POUR BTL (IV SOLUTION) ×2 IMPLANT
PACK BASIN MINOR ARMC (MISCELLANEOUS) ×2 IMPLANT
SLEEVE SCD COMPRESS THIGH MED (MISCELLANEOUS) ×2 IMPLANT
SOL PREP PVP 2OZ (MISCELLANEOUS)
SOLUTION PREP PVP 2OZ (MISCELLANEOUS) IMPLANT
STRETCH NET 2 107126 (MISCELLANEOUS) ×2 IMPLANT
SUT CHROMIC 3 0 SH 27 (SUTURE) ×2 IMPLANT
SUT CHROMIC 4 0 RB 1X27 (SUTURE) IMPLANT
SUT CHROMIC 4 0 SH 27 (SUTURE) IMPLANT
SUT VIC AB 3-0 SH 27 (SUTURE) ×1
SUT VIC AB 3-0 SH 27X BRD (SUTURE) ×1 IMPLANT
SUT VICRYL+ 3-0 144IN (SUTURE) ×2 IMPLANT
SYR 10ML LL (SYRINGE) ×2 IMPLANT
SYR BULB 3OZ (MISCELLANEOUS) ×2 IMPLANT

## 2017-10-27 NOTE — Progress Notes (Signed)
Postop check  No complaints; pain controlled.  Foley out and he has voided Afebrile, vital signs stable  Exam: Dressing clean and dry.  Moderate penile edema and ecchymoses.  Impression: Doing well status post penile exploration/corporal repair for penile fracture  Plan: Discharge home

## 2017-10-27 NOTE — Consult Note (Signed)
     Urology Consult  I have been asked to see the patient by Dr. Lamont Snowballifenbark, for evaluation and management of a penile fracture.  Chief Complaint: I broke my penis  History of Present Illness: Thomas Gaines is a 52 y.o. year old who states around midnight tonight he had a rigid erection and was forcibly bending his penis which was followed by significant pain and rapid detumescence.  He noted penile swelling immediately after.  He had significant pain in the ED necessitating a penile block by the ED physician.  He has been able to void successfully and the urine was clear.  He was recently seen in our office for microhematuria with lower urinary tract symptoms and underwent CT urogram and cystoscopy which was unremarkable.  Past Medical History:  Diagnosis Date  . Hyperlipidemia   . Hyperlipidemia   . Hypertension     Past Surgical History:  Procedure Laterality Date  . CATARACT EXTRACTION      Home Medications:  Current Facility-Administered Medications for the 10/27/17 encounter [facility](Hospital Encounter)  Medication  . lidocaine (XYLOCAINE) 2 % jelly 1 application   No outpatient medications have been marked as taking for the 10/27/17 encounter [facility](Hospital Encounter).    Allergies: No Known Allergies  Family History  Problem Relation Age of Onset  . Prostate cancer Neg Hx   . Bladder Cancer Neg Hx   . Kidney cancer Neg Hx     Social History:  reports that he has never smoked. He has never used smokeless tobacco. He reports that he does not drink alcohol or use drugs.  ROS: A complete review of systems was performed.  All systems are negative except for pertinent findings as noted.  Physical Exam:  Vital signs in last 24 hours: Temp:  [97.7 F (36.5 C)] 97.7 F (36.5 C) (09/20 0053) Pulse Rate:  [84-86] 84 (09/20 0100) Resp:  [20] 20 (09/20 0053) BP: (161-174)/(89-95) 161/89 (09/20 0100) SpO2:  [100 %] 100 % (09/20 0100) Weight:  [81.6 kg] 81.6 kg (09/20  0049) Constitutional:  Alert and oriented, No acute distress HEENT: [place] AT, moist mucus membranes.  Trachea midline, no masses Cardiovascular: Regular rate and rhythm, no clubbing, cyanosis, or edema. Respiratory: Normal respiratory effort, lungs clear bilaterally GI: Abdomen is soft, nontender, nondistended, no abdominal masses GU: No CVA tenderness.  Penis uncircumcised with moderate edema of the penile shaft.  Induration noted right proximal shaft at base.  No scrotal swelling.  Testes descended bilaterally without masses or tenderness. Skin: No rashes, bruises or suspicious lesions Lymph: No cervical or inguinal adenopathy Neurologic: Grossly intact, no focal deficits, moving all 4 extremities Psychiatric: Normal mood and affect   Impression/Assessment:  52 year old male with clinical history consistent with penile fracture.  Plan:  I discussed my findings with Thomas Gaines through a hospital interpreter.  I recommended urgent penile exploration with repair of corporal rupture if identified and possible cystoscopy.  The procedure was discussed in detail including potential risks of bleeding, infection and Peyronie's disease (penile curvature with erections).  Nonoperative management was discussed however he was informed there is a significantly higher rate of Peyronie's disease.  Nonoperative management was not recommended.  He has elected to proceed with surgical management.  10/27/2017, 2:21 AM  Irineo AxonScott Daundre Biel,  MD

## 2017-10-27 NOTE — ED Provider Notes (Addendum)
[facility] Emergency Department Provider Note  ____________________________________________   First MD Initiated Contact with Patient 10/27/17 [phone]     (approximate)  I have reviewed the triage vital signs and the nursing notes.   HISTORY  Chief Complaint Groin Swelling    HPI Thomas Gaines is a 52 y.o. male who comes to the emergency department by EMS with sudden onset severe right sided penile pain.  According to the patient he was not having sexual intercourse he awoke with an erection and rolled over in bed wall manipulating his penis and he felt a "pop" on the right side of his penis followed by immediate detumescence.  No history of the same.  He takes no blood thinning medications.  The pain is severe constant nonradiating.  Worse with movement improved with rest.    Past Medical History:  Diagnosis Date  . Hyperlipidemia   . Hyperlipidemia   . Hypertension     Patient Active Problem List   Diagnosis Date Noted  . Penile fracture 10/27/2017  . Burning sensation 07/17/2017  . 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 Headache disorder 12/12/2016  . Numbness and tingling of right leg 09/08/2016  . Chest pain with high risk for cardiac etiology 05/17/2016  . Allergic rhinitis 01/22/2016  . Anxiety 01/22/2016  . Depression 01/22/2016  . GERD (gastroesophageal reflux disease) 01/22/2016  . Hyperlipidemia 01/22/2016  . Hypertension 01/22/2016  . Tonsillar hypertrophy 01/22/2016  . Essential hypertension 10/23/2015  . Heart palpitations 10/23/2015  . RBBB 10/23/2015  . SOB (shortness of breath) on exertion 10/23/2015  . OSA on CPAP 04/01/2015  . Restless legs 04/01/2015  . Sleep disorder 04/01/2015  . Iron disorder 11/06/2013  . OSA (obstructive sleep apnea) 11/06/2013  . 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 Allergic rhinitis due to pollen 06/17/2011  . Deviated nasal septum 05/18/2011  . Hypertrophy of  tonsils 05/18/2011  . Obstructive sleep apnea 05/18/2011  . Disorder of upper respiratory system 05/18/2011    Past Surgical History:  Procedure Laterality Date  . CATARACT EXTRACTION      Prior to Admission medications   Medication Sig Start Date End Date Taking? Authorizing Provider  gabapentin (NEURONTIN) 300 MG capsule Take 300 mg by mouth every evening.     [provider]  lisinopril (PRINIVIL,ZESTRIL) 10 MG tablet Take 10 mg by mouth daily.     [provider]  metoprolol succinate (TOPROL-XL) 100 MG 24 hr tablet Take 100 mg by mouth daily.     [provider]  nystatin cream (MYCOSTATIN) Apply topically. 10/16/17 10/16/18  [provider]  pramipexole (MIRAPEX) 0.125 MG tablet Take 1 tab at night for a week then increase to 2 tabs at night and continue that dose 09/19/17   [provider]  pregabalin (LYRICA) 75 MG capsule Take 75 mg by mouth 2 (two) times daily.    [provider]  simvastatin (ZOCOR) 20 MG tablet Take 20 mg by mouth every evening.     [provider]  tamsulosin (FLOMAX) 0.4 MG CAPS capsule Take 1 capsule (0.4 mg total) by mouth daily. 10/18/17   Stoioff, Verna CzechScott C, MD  topiramate (TOPAMAX) 25 MG tablet Take by mouth. 10/16/17 10/16/18  [provider]    Allergies Patient has no known allergies.  Family History  Problem Relation Age of Onset  . Prostate cancer Neg Hx   . Bladder Cancer Neg Hx   . Kidney cancer Neg Hx  Social History Social History   Tobacco Use  . Smoking status: Never Smoker  . Smokeless tobacco: Never Used  Substance Use Topics  . Alcohol use: No  . Drug use: No    Types: Anabolic steroids    Review of Systems Constitutional: No fever/chills Eyes: No visual changes. ENT: No sore throat. Cardiovascular: Denies chest pain. Respiratory: Denies shortness of breath. Gastrointestinal: No abdominal pain.  Positive for nausea, no vomiting.   Genitourinary: Positive  for penile pain Musculoskeletal: Negative for back pain. Skin: Negative for rash. Neurological: Negative for headaches, focal weakness or numbness.   ____________________________________________   PHYSICAL EXAM:  VITAL SIGNS: ED Triage Vitals [10/27/17 0049]  Enc Vitals Group     BP      Pulse      Resp      Temp      Temp src      SpO2      Weight      Height      Head Circumference      Peak Flow      Pain Score 10     Pain Loc      Pain Edu?      Excl. in GC?     Constitutional: Alert and oriented x4 very anxious appearing nontoxic no diaphoresis speaks in full clear sentences Eyes: PERRL EOMI. Head: Atraumatic. Nose: No congestion/rhinnorhea. Mouth/Throat: No trismus Neck: No stridor.   Cardiovascular: Normal rate, regular rhythm. Grossly normal heart sounds.  Good peripheral circulation. Respiratory: Normal respiratory effort.  No retractions. Lungs CTAB and moving good air Gastrointestinal: Soft nontender Uncircumcised phallus deviated to the left swollen the base on the right.  No crepitus appreciated.  No ecchymosis.  No testicular pain or tenderness.  Normal testes normal lie Musculoskeletal: No lower extremity edema   Neurologic:  Normal speech and language. No gross focal neurologic deficits are appreciated. Skin:  Skin is warm, dry and intact. No rash noted. Psychiatric: Mood and affect are normal. Speech and behavior are normal.      ____________________________________________   DIFFERENTIAL includes but not limited to  Penile fracture, penile hematoma, testicular torsion ____________________________________________   LABS (all labs ordered are listed, but only abnormal results are displayed)  Labs Reviewed  URINALYSIS, COMPLETE (UACMP) WITH MICROSCOPIC - Abnormal; Notable for the following components:      Result Value   Color, Urine YELLOW (*)    APPearance CLEAR (*)    Hgb urine dipstick SMALL (*)    All other components within normal  limits    Urinalysis reviewed by me with essentially no blood __________________________________________  EKG   ____________________________________________  RADIOLOGY   ____________________________________________   PROCEDURES  Procedure(s) performed: Yes  .Nerve Block Date/Time: 10/27/2017 12:58 AM Performed by: Merrily Brittle, MD Authorized by: Merrily Brittle, MD   Consent:    Consent obtained:  Verbal   Consent given by:  Patient   Risks discussed:  Allergic reaction, bleeding, nerve damage, pain, unsuccessful block and swelling   Alternatives discussed:  Alternative treatment Indications:    Indications:  Pain relief Location:    Nerve block body site: Penis. Pre-procedure details:    Skin preparation:  2% chlorhexidine Skin anesthesia (see MAR for exact dosages):    Skin anesthesia method:  None Procedure details (see MAR for exact dosages):    Block needle gauge:  25 G   Anesthetic injected:  Bupivacaine 0.5% w/o epi   Injection procedure:  Anatomic landmarks identified, anatomic landmarks palpated,  incremental injection and negative aspiration for blood   Paresthesia:  None Post-procedure details:    Dressing:  None   Outcome:  Anesthesia achieved   Patient tolerance of procedure:  Tolerated well, no immediate complications Comments:     Used a total of 4 cc of 0.5% bupivacaine without epinephrine 2 on each side achieving complete anesthesia of his penis  .Critical Care Performed by: Merrily Brittle, MD Authorized by: Merrily Brittle, MD   Critical care provider statement:    Critical care time (minutes):  30   Critical care time was exclusive of:  Separately billable procedures and treating other patients   Critical care was necessary to treat or prevent imminent or life-threatening deterioration of the following conditions:  Trauma   Critical care was time spent personally by me on the following activities:  Development of treatment plan with  patient or surrogate, discussions with consultants, evaluation of patient's response to treatment, examination of patient, obtaining history from patient or surrogate, ordering and performing treatments and interventions, ordering and review of laboratory studies, ordering and review of radiographic studies, pulse oximetry, re-evaluation of patient's condition and review of old charts Comments:     Acute limb threatening traumatic injury    Critical Care performed: yes  ____________________________________________   INITIAL IMPRESSION / ASSESSMENT AND PLAN / ED COURSE  Pertinent labs & imaging results that were available during my care of the patient were reviewed by me and considered in my medical decision making (see chart for details).   As part of my medical decision making, I reviewed the following data within the electronic MEDICAL RECORD NUMBER History obtained from family if available, nursing notes, old chart and ekg, as well as notes from prior ED visits.  The patient comes to the emergency department with sudden onset severe pain to the right side of his penis after rolling over with an erection.  He heard a "pop" and then had immediate detumescence with swelling on the right side of his penis.  His pain was initially severe so I gave him a tablet of Norco and ibuprofen and consented him for dorsal nerve block.  Following the nerve block his pain is significantly improved.  I discussed the case with on-call urologist Dr. Lonna Cobb who indicated that the best course of action would be operative management and there is very little role for imaging in these cases.  He came to the bedside and consented the patient for operative management.  He is taken to the OR tonight.      ____________________________________________   FINAL CLINICAL IMPRESSION(S) / ED DIAGNOSES  Final diagnoses:  Fracture of corpus cavernosum penis, initial encounter      NEW MEDICATIONS STARTED DURING THIS  VISIT:  Current Discharge Medication List       Note:  This document was prepared using Dragon voice recognition software and may include unintentional dictation errors.     Merrily Brittle, MD 10/27/17 [id number]    Merrily Brittle, MD 10/27/17 [phone]

## 2017-10-27 NOTE — Progress Notes (Signed)
Please excuse Thomas LombardSergio Dirzo Hernandez from work for 1 week due to a medical condition.   Regards,  Riki AltesScott C Stoioff, MD [phone]

## 2017-10-27 NOTE — Anesthesia Post-op Follow-up Note (Signed)
Anesthesia QCDR form completed.        

## 2017-10-27 NOTE — ED Triage Notes (Signed)
Pt to the er for pain to the penis. Pt states he was in bed and got an erection and rolled over in bed and thinks he broke his penis.

## 2017-10-27 NOTE — Progress Notes (Signed)
Patient given discharge instructions using ipad interpreter. Wife present at bedside. All questions answered. Entire AVS reviewed. Called [facility] to confirm receipt of prescriptions per patient request.

## 2017-10-27 NOTE — Anesthesia Preprocedure Evaluation (Addendum)
Anesthesia Evaluation  Patient identified by MRN, date of birth, ID band Patient awake    Reviewed: Allergy & Precautions, NPO status , Patient's Chart, lab work & pertinent test results  Airway Mallampati: II  TM Distance: >3 FB     Dental  (+) Teeth Intact   Pulmonary sleep apnea ,    Pulmonary exam normal        Cardiovascular hypertension, Normal cardiovascular exam     Neuro/Psych  Headaches, PSYCHIATRIC DISORDERS Anxiety Depression    GI/Hepatic Neg liver ROS, GERD  ,  Endo/Other  negative endocrine ROS  Renal/GU negative Renal ROS     Musculoskeletal negative musculoskeletal ROS (+)   Abdominal Normal abdominal exam  (+)   Peds negative pediatric ROS (+)  Hematology negative hematology ROS (+)   Anesthesia Other Findings Past Medical History: No date: Hyperlipidemia No date: Hyperlipidemia No date: Hypertension  Normal stress echo in 2018 which revealed no wall motion abnormalities and EF > 55%  Reproductive/Obstetrics                           Anesthesia Physical Anesthesia Plan  ASA: II and emergent  Anesthesia Plan: General   Post-op Pain Management:    Induction: Intravenous, Rapid sequence and Cricoid pressure planned  PONV Risk Score and Plan:   Airway Management Planned: Oral ETT  Additional Equipment:   Intra-op Plan:   Post-operative Plan: Extubation in OR  Informed Consent: I have reviewed the patients History and Physical, chart, labs and discussed the procedure including the risks, benefits and alternatives for the proposed anesthesia with the patient or authorized representative who has indicated his/her understanding and acceptance.   Dental advisory given  Plan Discussed with: CRNA and Surgeon  Anesthesia Plan Comments:         Anesthesia Quick Evaluation

## 2017-10-27 NOTE — Op Note (Signed)
Preoperative diagnosis:  1. Penile fracture  Postoperative diagnosis:  1. Penile fracture  Procedure: 1. Penile exploration with repair of tear right corpus cavernosum  Surgeon: [name], MD  Anesthesia: General  Complications: None  Intraoperative findings: Approximately 1 cm transverse tear right corpus cavernosum proximal shaft.  No urethral involvement  EBL: Minimal  Specimens: None  Indication: Thomas Gaines is a 52 y.o. patient who early this morning was manipulating his erect penis and had significant pain followed by rapid detumescence.  Initial exam remarkable for moderate penile edema without significant ecchymoses.  after reviewing the management options for treatment, he elected to proceed with the above surgical procedure(s). We have discussed the potential benefits and risks of the procedure, side effects of the proposed treatment, the likelihood of the patient achieving the goals of the procedure, and any potential problems that might occur during the procedure or recuperation. Informed consent has been obtained.  Description of procedure:  The patient was taken to the operating room and general anesthesia was induced.  The patient was placed in the supine position, prepped and draped in the usual sterile fashion, and preoperative antibiotics were administered. A preoperative time-out was performed.   His prepuce was retracted and a Foley catheter was placed and clamped.  A circumferential incision was made approximately 10 mm proximal to the corona radiata.  Using Metzenbaum scissors this incision was carried deeper to expose Buck's fascia.  The superficial dorsal penile vein was identified and ligated with a 3-0 Vicryl real-tie.  The penis was then degloved working proximally.  Hematoma was identified on the right proximal shaft.  Bucks fascia was opened over the hematoma and a moderate size clot was extracted.  An approximately 10 mm tear was noted in the  right corpus cavernosum.  The medial aspect did not involve the urethra or corporal spongiosum.  The site was copiously irrigated with sterile saline.  The corporal tear was closed with interrupted 3-0 Vicryl suture.  The site was again irrigated.  The skin edges were reapproximated with interrupted 3-0 Vicryl suture.  A dressing of Xeroform gauze, Kling and stretch net was applied.  After anesthetic reversal he was transported to the PACU in stable condition.  [name], M.D.  Plan: We will admit to short stay observation discharge later today.

## 2017-10-27 NOTE — Anesthesia Procedure Notes (Signed)
Procedure Name: Intubation Date/Time: 10/27/2017 3:49 AM Performed by: Irving BurtonBachich, Avriel Kandel, CRNA Pre-anesthesia Checklist: Patient identified, Emergency Drugs available, Suction available and Patient being monitored Patient Re-evaluated:Patient Re-evaluated prior to induction Oxygen Delivery Method: Circle system utilized Preoxygenation: Pre-oxygenation with 100% oxygen Induction Type: IV induction Ventilation: Mask ventilation without difficulty Laryngoscope Size: 4 and McGraph Grade View: Grade I Tube type: Oral Tube size: 7.5 mm Number of attempts: 2 Airway Equipment and Method: Stylet and Video-laryngoscopy Placement Confirmation: ETT inserted through vocal cords under direct vision,  positive ETCO2 and breath sounds checked- equal and bilateral Secured at: 22 cm Tube secured with: Tape Dental Injury: Teeth and Oropharynx as per pre-operative assessment  Difficulty Due To: Difficulty was anticipated and Difficult Airway- due to anterior larynx

## 2017-10-27 NOTE — Discharge Instructions (Signed)
Instrucciones de alta medica:  1. Se enviaron recetas de medicamentos para el dolor y un antibiotico a [facility] 2. Mantenga el area seca y el bano de esponja solo hasta el 23. Puedes ducharte el 23. No baneras, jacuzzis o natacion durante al menos 10 dias. 3. Puedes quitarte el vendaje en 48 horas. Si se siente demasiado apretado o se moja con orina, puede eliminarlo antes. 4. La bolso de hielo ayudara a la incomodidad e hinchazon. 5. Tendras contusiones significativas 6. Comuniquese con las oficina o vaya a la sala de emergencias para empeorar el dolor, fiebre de mas de 101 grados, enrojecimiento, aumento de la inflamacion o drenaje de la herida.  7. Por favor no retraigas tu prepucio 8.Administrator, arts Sin relaciones sexuales ni mastrubacion durante 6 semanas 9. Las suturas se disolverian 10. No levantar mas de 10-15 libras por 2 semanas. Sin actividad extenuante durante un mes.    Fractura peniana (Penile Fracture) La fractura peniana es la rotura de una capa de tejido que recubre el pene cuando este est erecto. El pene se erecta cuando ciertas estructuras que hay en su interior (cuerpos cavernosos) se llenan de Troysangre. Estas estructuras estn envueltas por una membrana fibrosa (tnica albugnea), la cual puede romperse durante las relaciones sexuales vigorosas. Los cuerpos cavernosos tambin pueden romperse. En casos poco frecuentes, la rotura puede comprometer al conducto por el cual se elimina la orina a travs del pene (uretra). Si se produce la rotura de la tnica Fondaalbugnea, se filtra sangre de los cuerpos cavernosos y Facilities managerllega a los tejidos circundantes (hematoma). La rotura de la uretra puede causar una hemorragia a travs del orificio de la uretra o imposibilitar la miccin. La fractura peniana es una emergencia mdica. CAUSAS Generalmente, la causa de una fractura peniana es una relacin sexual vigorosa o una fuerza brusca en un pene erecto. FACTORES DE RIESGO Es ms probable que esta afeccin se  produzca en los hombres expuestos a lo siguiente:  Cualquier tipo de actividad sexual vigorosa.  Coito con la pareja sexual encima. Esta posicin aumenta la probabilidad de que el pene se salga. El peso de la pareja sobre el pene erecto puede causar Market researcheruna fractura. Blake DivineSIGNOS Y SNTOMAS Los primeros signos de una fractura peniana incluyen lo siguiente:  Un ruido de crujido o de chasquido.  Dolor de pene intenso.  Prdida de la ereccin. En el transcurso de un perodo de Freescale Semiconductortiempo corto, pueden aparecer otros signos, entre ellos:  Hinchazn del pene.  Hematomas y cambio de color del pene.  Una curvatura en el pene lejos de la fractura (deformidad). DIAGNSTICO El mdico puede diagnosticar la fractura peniana en funcin de los signos y los sntomas, y de la descripcin de la lesin. Tambin Civil engineer, contractingle realizar un examen fsico. Este puede incluir estudios de diagnstico por imgenes, como los siguientes:  Hydrologistcografa. Este estudio determina la ubicacin de la fractura y su magnitud.  Una exploracin radiolgica del flujo de sangre en el pene (cavernosografa). Este estudio comprueba qu vasos sanguneos estn daados.  Una radiografa de la uretra (uretrografa). Este estudio revisa si la uretra est rota.  Resonancia magntica. Este estudio brinda informacin ms detallada sobre la extensin del dao del tejido afectado en la fractura. TRATAMIENTO La fractura peniana es una lesin grave que requiere Azerbaijanciruga de emergencia para lo siguiente:  Extraer los hematomas.  Reparar el tejido y las [address] que se han roto.  Reparar la uretra, si es necesario. Esta informacin no tiene Theme park managercomo fin reemplazar el consejo del mdico. Asegrese de  hacerle al mdico cualquier pregunta que tenga. Document Released: 04/22/2008 Document Revised: 02/14/2014 Document Reviewed: 11/28/2013 Elsevier Interactive Patient Education  2018 [employer].

## 2017-10-27 NOTE — Transfer of Care (Signed)
Immediate Anesthesia Transfer of Care Note  Patient: Thomas Gaines  Procedure(s) Performed: repair of penile fracture (N/A Penis)  Patient Location: PACU  Anesthesia Type:General  Level of Consciousness: sedated  Airway & Oxygen Therapy: Patient connected to face mask oxygen  Post-op Assessment: Post -op Vital signs reviewed and stable  Post vital signs: stable  Last Vitals:  Vitals Value Taken Time  BP 121/71 10/27/2017  5:19 AM  Temp    Pulse 92 10/27/2017  5:19 AM  Resp 15 10/27/2017  5:19 AM  SpO2 99 % 10/27/2017  5:19 AM  Vitals shown include unvalidated device data.  Last Pain:  Vitals:   10/27/17 0143  TempSrc:   PainSc: 10-Worst pain ever         Complications: No apparent anesthesia complications

## 2017-10-31 NOTE — Anesthesia Postprocedure Evaluation (Signed)
Anesthesia Post Note  Patient: Thomas LombardSergio Dirzo Gaines  Procedure(s) Performed: repair of penile fracture (N/A Penis)  Patient location during evaluation: PACU Anesthesia Type: General Level of consciousness: awake and alert and oriented Pain management: pain level controlled Vital Signs Assessment: post-procedure vital signs reviewed and stable Respiratory status: spontaneous breathing Cardiovascular status: blood pressure returned to baseline Anesthetic complications: no     Last Vitals:  Vitals:   10/27/17 0615 10/27/17 0635  BP: 138/79 (!) 147/86  Pulse: 78 78  Resp: 15 16  Temp:  36.6 C  SpO2: 95% 99%    Last Pain:  Vitals:   10/27/17 1130  TempSrc:   PainSc: 5                  Natalyah Cummiskey

## 2017-11-28 ENCOUNTER — Ambulatory Visit: Payer: BLUE CROSS/BLUE SHIELD | Admitting: Urology

## 2017-11-28 ENCOUNTER — Encounter: Payer: Self-pay | Admitting: Urology

## 2017-11-28 VITALS — BP 167/84 | HR 79 | Ht 66.0 in | Wt 198.2 lb

## 2017-11-28 DIAGNOSIS — N401 Enlarged prostate with lower urinary tract symptoms: Secondary | ICD-10-CM

## 2017-11-28 DIAGNOSIS — R3129 Other microscopic hematuria: Secondary | ICD-10-CM | POA: Diagnosis not present

## 2017-11-28 DIAGNOSIS — S39840D Fracture of corpus cavernosum penis, subsequent encounter: Secondary | ICD-10-CM

## 2017-11-28 DIAGNOSIS — N138 Other obstructive and reflux uropathy: Secondary | ICD-10-CM

## 2017-11-28 LAB — BLADDER SCAN AMB NON-IMAGING: Scan Result: 0

## 2017-11-28 NOTE — Progress Notes (Signed)
11/28/2017 4:06 PM   Thomas Gaines 1965/10/11 [id number]  Referring provider: Barbette Reichmann, MD [address] [facility] [place], [place] [address]  Chief Complaint  Patient presents with  . Follow-up    HPI: Patient is a 52 -year-old Hispanic male with a history of hematuria, BPH with LUTS and penile fracture who presents today for 6-week follow-up with interpreter, [place].    History of hematuria  CTU in 10/2017 revealed no cause for hematuria.  Cystoscopy in 10/2017 revealed an enlarged friable prostate.  He denies any gross hematuria.  BPH WITH LUTS  (prostate and/or bladder) IPSS score: 12/5   PVR: 0 mL      Major complaint(s): weak stream  x few years. Denies any dysuria, hematuria or suprapubic pain.   Currently taking: Tamsulosin  Denies any recent fevers, chills, nausea or vomiting.  He does not have a family history of PCa.  IPSS    Row Name 11/28/17 1500         International Prostate Symptom Score   How often have you had the sensation of not emptying your bladder?  Almost always     How often have you had to urinate less than every two hours?  Less than half the time     How often have you found you stopped and started again several times when you urinated?  Not at All     How often have you found it difficult to postpone urination?  Not at All     How often have you had a weak urinary stream?  Almost always     How often have you had to strain to start urination?  Not at All     How many times did you typically get up at night to urinate?  None     Total IPSS Score  12       Quality of Life due to urinary symptoms   If you were to spend the rest of your life with your urinary condition just the way it is now how would you feel about that?  Unhappy        Score:  1-7 Mild 8-19 Moderate 20-35 Severe  Penile fracture Patient underwent a penile exploration and repair of a tear of right corpus cavernosum on 10/27/2017.   Post-procedural course was as expected and non eventful.   He is not having any further bruising or penile pain.   PMH: Past Medical History:  Diagnosis Date  . Hyperlipidemia   . Hyperlipidemia   . Hypertension     Surgical History: Past Surgical History:  Procedure Laterality Date  . CATARACT EXTRACTION    . CIRCUMCISION N/A 10/27/2017   Procedure: repair of penile fracture;  Surgeon: Riki Altes, MD;  Location: [facility] ORS;  Service: Urology;  Laterality: N/A;    Home Medications:  Allergies as of 11/28/2017   No Known Allergies     Medication List        Accurate as of 11/28/17  4:06 PM. Always use your most recent med list.          gabapentin 300 MG capsule Commonly known as:  NEURONTIN Take 300 mg by mouth every evening.   lisinopril 20 MG tablet Commonly known as:  PRINIVIL,ZESTRIL Take 10 mg by mouth daily.   metoprolol succinate 100 MG 24 hr tablet Commonly known as:  TOPROL-XL Take 100 mg by mouth daily.   nystatin cream Commonly known as:  MYCOSTATIN  Apply topically.   oxyCODONE-acetaminophen 5-325 MG tablet Commonly known as:  PERCOCET/ROXICET Take 1-2 tablets by mouth every 6 (six) hours as needed for moderate pain.   pramipexole 0.125 MG tablet Commonly known as:  MIRAPEX Take 1 tab at night for a week then increase to 2 tabs at night and continue that dose   pregabalin 75 MG capsule Commonly known as:  LYRICA Take 75 mg by mouth 2 (two) times daily.   simvastatin 20 MG tablet Commonly known as:  ZOCOR Take 20 mg by mouth every evening.   sulfamethoxazole-trimethoprim 800-160 MG tablet Commonly known as:  BACTRIM DS,SEPTRA DS Take 1 tablet by mouth 2 (two) times daily.   tamsulosin 0.4 MG Caps capsule Commonly known as:  FLOMAX Take 1 capsule (0.4 mg total) by mouth daily.   topiramate 25 MG tablet Commonly known as:  TOPAMAX Take by mouth.       Allergies: No Known Allergies  Family History: Family History  Problem  Relation Age of Onset  . Prostate cancer Neg Hx   . Bladder Cancer Neg Hx   . Kidney cancer Neg Hx     Social History:  reports that he has never smoked. He has never used smokeless tobacco. He reports that he does not drink alcohol or use drugs.  ROS: UROLOGY Frequent Urination?: No Hard to postpone urination?: No Burning/pain with urination?: No Get up at night to urinate?: No Leakage of urine?: No Urine stream starts and stops?: No Trouble starting stream?: No Do you have to strain to urinate?: No Blood in urine?: No Urinary tract infection?: No Sexually transmitted disease?: No Injury to kidneys or bladder?: No Painful intercourse?: No Weak stream?: Yes Erection problems?: No Penile pain?: No  Gastrointestinal Nausea?: No Vomiting?: No Indigestion/heartburn?: No Diarrhea?: No Constipation?: No  Constitutional Fever: No Night sweats?: No Weight loss?: No Fatigue?: No  Skin Skin rash/lesions?: No Itching?: No  Eyes Blurred vision?: No Double vision?: No  Ears/Nose/Throat Sore throat?: No Sinus problems?: No  Hematologic/Lymphatic Swollen glands?: No Easy bruising?: No  Cardiovascular Leg swelling?: No Chest pain?: No  Respiratory Cough?: No Shortness of breath?: No     Musculoskeletal Back pain?: No Joint pain?: Yes  Neurological Headaches?: No Dizziness?: No  Psychologic Depression?: No Anxiety?: Yes  Physical Exam: BP (!) 167/84 (BP Location: Left Arm, Patient Position: Sitting, Cuff Size: Normal)   Pulse 79   Ht 5\' 6"  (1.676 m)   Wt 198 lb 3.2 oz (89.9 kg)   BMI 31.99 kg/m   Constitutional: Well nourished. Alert and oriented, No acute distress. HEENT: [place] AT, moist mucus membranes. Trachea midline, no masses. Cardiovascular: No clubbing, cyanosis, or edema. Respiratory: Normal respiratory effort, no increased work of breathing. GI: Abdomen is soft, non tender, non distended, no abdominal masses. Liver and spleen not  palpable.  No hernias appreciated.  Stool sample for occult testing is not indicated.   GU: No CVA tenderness.  No bladder fullness or masses.  Patient with uncircumcised phallus.  Foreskin easily retracted   Urethral meatus is patent.  No penile discharge. No penile lesions or rashes. Scrotum without lesions, cysts, rashes and/or edema.  Testicles are located scrotally bilaterally. No masses are appreciated in the testicles. Left and right epididymis are normal. Skin: No rashes, bruises or suspicious lesions. Lymph: No cervical or inguinal adenopathy. Neurologic: Grossly intact, no focal deficits, moving all 4 extremities. Psychiatric: Normal mood and affect.  Laboratory Data: Lab Results  Component Value Date  WBC 10.0 08/18/2016   HGB 14.7 08/18/2016   HCT 42.6 08/18/2016   MCV 90.1 08/18/2016   PLT 224 08/18/2016    Lab Results  Component Value Date   CREATININE 0.95 09/06/2017    No results found for: PSA  No results found for: TESTOSTERONE  No results found for: HGBA1C  No results found for: TSH  No results found for: CHOL, HDL, CHOLHDL, VLDL, LDLCALC  Lab Results  Component Value Date   AST 26 08/18/2016   Lab Results  Component Value Date   ALT 34 08/18/2016   No components found for: ALKALINEPHOPHATASE No components found for: BILIRUBINTOTAL  No results found for: ESTRADIOL  I have reviewed the labs.  Pertinent Imaging: Non contrast CT in 10/2013 was negative Scrotal ultrasound in 03/2016 was negative CTU in 10/2017 was negative  Assessment & Plan:    1. History of hematuria Hematuria work up completed in 10/2017 - findings positive for mild lateral lobe enlargement prostate with prominent hypervascularity and friability No report of gross hematuria  RTC in one year for UA - patient to report any gross hematuria in the interim    2. Penile fracture  S/P repair of right corpus cavernosum on 10/27/2017 - doing well   Return for january .  These  notes generated with voice recognition software. I apologize for typographical errors.  Michiel Cowboy, PA-C  [facility] [address]  [place], [place] [address] [phone]

## 2017-11-29 ENCOUNTER — Telehealth: Payer: Self-pay

## 2017-11-29 LAB — PSA: Prostate Specific Ag, Serum: 1.1 ng/mL (ref 0.0–4.0)

## 2017-11-29 NOTE — Telephone Encounter (Signed)
-----   Message from Harle Battiest, PA-C sent at 11/29/2017  7:31 AM EDT ----- Please let Mr. Thomas Gaines know that his PSA is normal at 1.1.

## 2017-11-29 NOTE — Telephone Encounter (Signed)
Pt informed

## 2017-12-08 ENCOUNTER — Encounter: Payer: Self-pay | Admitting: Urology

## 2017-12-08 ENCOUNTER — Ambulatory Visit: Payer: BLUE CROSS/BLUE SHIELD | Admitting: Urology

## 2017-12-08 VITALS — BP 135/83 | HR 72 | Ht 66.0 in | Wt 200.0 lb

## 2017-12-08 DIAGNOSIS — N4889 Other specified disorders of penis: Secondary | ICD-10-CM

## 2017-12-08 DIAGNOSIS — R3129 Other microscopic hematuria: Secondary | ICD-10-CM

## 2017-12-08 LAB — URINALYSIS, COMPLETE
BILIRUBIN UA: NEGATIVE
GLUCOSE, UA: NEGATIVE
Ketones, UA: NEGATIVE
Leukocytes, UA: NEGATIVE
NITRITE UA: NEGATIVE
Protein, UA: NEGATIVE
SPEC GRAV UA: 1.02 (ref 1.005–1.030)
UUROB: 0.2 mg/dL (ref 0.2–1.0)
pH, UA: 5.5 (ref 5.0–7.5)

## 2017-12-08 LAB — MICROSCOPIC EXAMINATION
Bacteria, UA: NONE SEEN
Epithelial Cells (non renal): NONE SEEN /hpf (ref 0–10)
WBC UA: NONE SEEN /HPF (ref 0–5)

## 2017-12-08 LAB — BLADDER SCAN AMB NON-IMAGING: SCAN RESULT: 0

## 2017-12-08 NOTE — Progress Notes (Signed)
12/08/2017 10:39 AM   Thomas Gaines 1965-06-04 [id number]  Referring provider: Barbette Reichmann, MD [address] [facility] [place], [place] [address]  Chief Complaint  Patient presents with  . Testicular Mass    HPI: Patient is a 52 -year-old Hispanic male with a history of hematuria, BPH with LUTS and penile fracture who presents today for the complaint of a lump on his penis.  Patient states that 2 days after I saw him postoperatively for his penile fracture repair he noted a lump in his penis.  He stated that it is not painful and has not interfered with erections.  Penile fracture Patient underwent a penile exploration and repair of a tear of right corpus cavernosum on 10/27/2017.  Post-procedural course was as expected and non eventful.   He is not having any further bruising or penile pain.   PMH: Past Medical History:  Diagnosis Date  . Hyperlipidemia   . Hyperlipidemia   . Hypertension     Surgical History: Past Surgical History:  Procedure Laterality Date  . CATARACT EXTRACTION    . CIRCUMCISION N/A 10/27/2017   Procedure: repair of penile fracture;  Surgeon: Riki Altes, MD;  Location: [facility] ORS;  Service: Urology;  Laterality: N/A;    Home Medications:  Allergies as of 12/08/2017   No Known Allergies     Medication List        Accurate as of 12/08/17 10:39 AM. Always use your most recent med list.          gabapentin 300 MG capsule Commonly known as:  NEURONTIN Take 300 mg by mouth every evening.   lisinopril 20 MG tablet Commonly known as:  PRINIVIL,ZESTRIL Take 10 mg by mouth daily.   metoprolol succinate 100 MG 24 hr tablet Commonly known as:  TOPROL-XL Take 100 mg by mouth daily.   nystatin cream Commonly known as:  MYCOSTATIN Apply topically.   oxyCODONE-acetaminophen 5-325 MG tablet Commonly known as:  PERCOCET/ROXICET Take 1-2 tablets by mouth every 6 (six) hours as needed for moderate pain.     pramipexole 0.125 MG tablet Commonly known as:  MIRAPEX Take 1 tab at night for a week then increase to 2 tabs at night and continue that dose   pregabalin 75 MG capsule Commonly known as:  LYRICA Take 75 mg by mouth 2 (two) times daily.   simvastatin 20 MG tablet Commonly known as:  ZOCOR Take 20 mg by mouth every evening.   sulfamethoxazole-trimethoprim 800-160 MG tablet Commonly known as:  BACTRIM DS,SEPTRA DS Take 1 tablet by mouth 2 (two) times daily.   tamsulosin 0.4 MG Caps capsule Commonly known as:  FLOMAX Take 1 capsule (0.4 mg total) by mouth daily.   topiramate 25 MG tablet Commonly known as:  TOPAMAX Take by mouth.       Allergies: No Known Allergies  Family History: Family History  Problem Relation Age of Onset  . Prostate cancer Neg Hx   . Bladder Cancer Neg Hx   . Kidney cancer Neg Hx     Social History:  reports that he has never smoked. He has never used smokeless tobacco. He reports that he does not drink alcohol or use drugs.  ROS: UROLOGY Frequent Urination?: No Hard to postpone urination?: No Burning/pain with urination?: No Get up at night to urinate?: No Leakage of urine?: No Urine stream starts and stops?: No Trouble starting stream?: No Do you have to strain to urinate?: No Blood in  urine?: No Urinary tract infection?: No Sexually transmitted disease?: No Injury to kidneys or bladder?: No Painful intercourse?: No Weak stream?: No Erection problems?: No Penile pain?: No  Gastrointestinal Nausea?: No Vomiting?: No Indigestion/heartburn?: No Diarrhea?: No Constipation?: No  Constitutional Fever: No Night sweats?: No Weight loss?: No Fatigue?: No  Skin Skin rash/lesions?: No Itching?: No  Eyes Blurred vision?: No Double vision?: No  Ears/Nose/Throat Sore throat?: No Sinus problems?: No  Hematologic/Lymphatic Easy bruising?: No  Cardiovascular Leg swelling?: No Chest pain?: No  Respiratory Cough?:  No Shortness of breath?: No  Endocrine Excessive thirst?: No  Musculoskeletal Back pain?: No Joint pain?: No  Neurological Headaches?: No Dizziness?: No  Psychologic Depression?: No Anxiety?: No  Physical Exam: BP 135/83 (BP Location: Left Arm, Patient Position: Sitting, Cuff Size: Normal)   Pulse 72   Ht 5\' 6"  (1.676 m)   Wt 200 lb (90.7 kg)   BMI 32.28 kg/m   Constitutional: Well nourished. Alert and oriented, No acute distress. HEENT: [place] AT, moist mucus membranes. Trachea midline, no masses. Cardiovascular: No clubbing, cyanosis, or edema. Respiratory: Normal respiratory effort, no increased work of breathing. GI: Abdomen is soft, non tender, non distended, no abdominal masses. Liver and spleen not palpable.  No hernias appreciated.  Stool sample for occult testing is not indicated.   GU: No CVA tenderness.  No bladder fullness or masses.  Patient with uncircumcised phallus.  Foreskin easily retracted   Urethral meatus is patent.  No penile discharge. No penile lesions or rashes. Scrotum without lesions, cysts, rashes and/or edema.  Testicles are located scrotally bilaterally. No masses are appreciated in the testicles. Left and right epididymis are normal.  The lump that he is feeling is located in the right base of the penis and is consistent with the postoperative findings of a repair of penile fracture. Skin: No rashes, bruises or suspicious lesions. Lymph: No cervical or inguinal adenopathy. Neurologic: Grossly intact, no focal deficits, moving all 4 extremities. Psychiatric: Normal mood and affect.  Laboratory Data: Lab Results  Component Value Date   WBC 10.0 08/18/2016   HGB 14.7 08/18/2016   HCT 42.6 08/18/2016   MCV 90.1 08/18/2016   PLT 224 08/18/2016    Lab Results  Component Value Date   CREATININE 0.95 09/06/2017    No results found for: PSA  No results found for: TESTOSTERONE  No results found for: HGBA1C  No results found for: TSH  No  results found for: CHOL, HDL, CHOLHDL, VLDL, LDLCALC  Lab Results  Component Value Date   AST 26 08/18/2016   Lab Results  Component Value Date   ALT 34 08/18/2016   No components found for: ALKALINEPHOPHATASE No components found for: BILIRUBINTOTAL  No results found for: ESTRADIOL  I have reviewed the labs.  Pertinent Imaging: Non contrast CT in 10/2013 was negative Scrotal ultrasound in 03/2016 was negative CTU in 10/2017 was negative  Assessment & Plan:    1. History of hematuria Hematuria work up completed in 10/2017 - findings positive for mild lateral lobe enlargement prostate with prominent hypervascularity and friability No report of gross hematuria  RTC in one year for UA - patient to report any gross hematuria in the interim    2. Penile fracture  S/P repair of right corpus cavernosum on 10/27/2017 - doing well  Patient is given reassurance that this is the normal healing process for penile fracture He will contact our office if he should notice any pain or been to the  penis during erections  Return for for appt. in 02/2018.  These notes generated with voice recognition software. I apologize for typographical errors.  Michiel Cowboy, PA-C  [facility] [address]  [place], [place] [address] [phone]

## 2017-12-11 LAB — CULTURE, URINE COMPREHENSIVE

## 2018-02-17 NOTE — Progress Notes (Incomplete)
02/17/2018  4:31 PM   Thomas Gaines 1965/10/16 [id number]  Referring provider: Barbette Reichmann, MD [address] [facility] [place], [place] [address]  No chief complaint on file.   HPI: Thomas Gaines is a 53 y.o. male Hispanic with a history of hematuria, BPH with LUTS and penile fracture who presents today for a ***follow-up on a lump on his penis.  Background History On 12/08/2017, patient stated that 2 days after I saw him postoperatively for his penile fracture repair (11/28/2017), he noted a lump in his penis.  He stated that it was not painful and has not interfered with erections.  Penile fracture Patient underwent a penile exploration and repair of a tear of right corpus cavernosum on 10/27/2017.  Post-procedural course was as expected and non eventful.   He ***is not having any further bruising or penile pain.   ***  PMH: Past Medical History:  Diagnosis Date   Hyperlipidemia    Hyperlipidemia    Hypertension     Surgical History: Past Surgical History:  Procedure Laterality Date   CATARACT EXTRACTION     CIRCUMCISION N/A 10/27/2017   Procedure: repair of penile fracture;  Surgeon: Riki Altes, MD;  Location: [facility] ORS;  Service: Urology;  Laterality: N/A;    Home Medications:  Allergies as of 02/20/2018   No Known Allergies     Medication List       Accurate as of February 17, 2018  4:31 PM. Always use your most recent med list.        gabapentin 300 MG capsule Commonly known as:  NEURONTIN Take 300 mg by mouth every evening.   lisinopril 20 MG tablet Commonly known as:  PRINIVIL,ZESTRIL Take 10 mg by mouth daily.   metoprolol succinate 100 MG 24 hr tablet Commonly known as:  TOPROL-XL Take 100 mg by mouth daily.   nystatin cream Commonly known as:  MYCOSTATIN Apply topically.   oxyCODONE-acetaminophen 5-325 MG tablet Commonly known as:  PERCOCET/ROXICET Take 1-2 tablets by mouth every 6 (six)  hours as needed for moderate pain.   pramipexole 0.125 MG tablet Commonly known as:  MIRAPEX Take 1 tab at night for a week then increase to 2 tabs at night and continue that dose   pregabalin 75 MG capsule Commonly known as:  LYRICA Take 75 mg by mouth 2 (two) times daily.   simvastatin 20 MG tablet Commonly known as:  ZOCOR Take 20 mg by mouth every evening.   sulfamethoxazole-trimethoprim 800-160 MG tablet Commonly known as:  BACTRIM DS,SEPTRA DS Take 1 tablet by mouth 2 (two) times daily.   tamsulosin 0.4 MG Caps capsule Commonly known as:  FLOMAX Take 1 capsule (0.4 mg total) by mouth daily.   topiramate 25 MG tablet Commonly known as:  TOPAMAX Take by mouth.       Allergies: No Known Allergies  Family History: Family History  Problem Relation Age of Onset   Prostate cancer Neg Hx    Bladder Cancer Neg Hx    Kidney cancer Neg Hx     Social History:  reports that he has never smoked. He has never used smokeless tobacco. He reports that he does not drink alcohol or use drugs.  ROS:                                        Physical Exam:  There were no vitals taken for this visit.  Constitutional:  Well nourished. Alert and oriented, No acute distress. {HEENT: [place] AT, moist mucus membranes.  Trachea midline, no masses.} Cardiovascular: No clubbing, cyanosis, or edema. Respiratory: Normal respiratory effort, no increased work of breathing. {GI: Abdomen is soft, non tender, non distended, no abdominal masses. Liver and spleen not palpable.  No hernias appreciated.  Stool sample for occult testing is not indicated.   GU: No CVA tenderness.  No bladder fullness or masses.  Patient with circumcised/uncircumcised phallus. ***Foreskin easily retracted***  Urethral meatus is patent.  No penile discharge. No penile lesions or rashes. Scrotum without lesions, cysts, rashes and/or edema.  Testicles are located scrotally bilaterally. No masses are  appreciated in the testicles. Left and right epididymis are normal. Rectal: Patient with  normal sphincter tone. Anus and perineum without scarring or rashes. No rectal masses are appreciated. Prostate is approximately *** grams, *** nodules are appreciated. Seminal vesicles are normal.} Skin: No rashes, bruises or suspicious lesions. {Lymph: No cervical or inguinal adenopathy.} Neurologic: Grossly intact, no focal deficits, moving all 4 extremities. Psychiatric: Normal mood and affect.  Laboratory Data: Lab Results  Component Value Date   WBC 10.0 08/18/2016   HGB 14.7 08/18/2016   HCT 42.6 08/18/2016   MCV 90.1 08/18/2016   PLT 224 08/18/2016    Lab Results  Component Value Date   CREATININE 0.95 09/06/2017    No results found for: PSA  No results found for: TESTOSTERONE  No results found for: HGBA1C  No results found for: TSH  No results found for: CHOL, HDL, CHOLHDL, VLDL, LDLCALC  Lab Results  Component Value Date   AST 26 08/18/2016   Lab Results  Component Value Date   ALT 34 08/18/2016   No components found for: ALKALINEPHOPHATASE No components found for: BILIRUBINTOTAL  No results found for: ESTRADIOL  I have reviewed the labs.  Assessment & Plan:    1. History of hematuria - Hematuria work up completed in 10/2017 - findings positive for mild lateral lobe enlargement prostate with prominent hypervascularity and friability ***- No report of gross hematuria  *** - RTC in one year for UA - patient to report any gross hematuria in the interim    2. Penile fracture  - S/P repair of right corpus cavernosum on 10/27/2017 - doing well  *** - Patient is given reassurance that this is the normal healing process for penile fracture - He will contact our office if he should notice any pain or been to the penis during erections  No follow-ups on file.  These notes generated with voice recognition software. I apologize for typographical errors.  Duanne Moron  [facility] [address]  [place], [place] [address] [phone]  I, Duanne Moron, am acting as a Neurosurgeon for [employer], PA-C.   {Add Scribe Attestation Statement}

## 2018-02-20 ENCOUNTER — Encounter: Payer: Self-pay | Admitting: Urology

## 2018-02-20 ENCOUNTER — Ambulatory Visit: Payer: BLUE CROSS/BLUE SHIELD | Admitting: Urology

## 2018-02-23 ENCOUNTER — Telehealth: Payer: Self-pay | Admitting: Urology

## 2018-02-23 NOTE — Telephone Encounter (Signed)
Would you call the patient and have him schedule a follow up appointment for hematuria in 10/2018?

## 2018-03-01 DIAGNOSIS — E786 Lipoprotein deficiency: Secondary | ICD-10-CM | POA: Insufficient documentation

## 2018-03-08 NOTE — Progress Notes (Signed)
03/09/2018  11:59 AM   Thomas Gaines Mar 11, 1965 [id number]  Referring provider: Barbette Reichmann, MD [address] [facility] [place], [place] [address]  No chief complaint on file.   HPI: Thomas Gaines is a 53 y.o. male Hispanic with a history of hematuria, BPH with LUTS and penile fracture who presents today for the complaint of a lump on his penis.  Background History Patient underwent a penile exploration and repair of a tear of right corpus cavernosum on 10/27/2017.  Post-procedural course was as expected and non eventful.   He was not, as of 12/2017, having any further bruising or penile pain.  On 12/08/2017, patient stated that 2 days after I saw him postoperatively for his penile fracture repair he noted a lump in his penis.  He stated that it was not painful and has not interfered with erections.  UA done today (03/09/2018) is negative.  Patient reports things are okay, though the lump has remained.  He requested an exam, because he is concerned about the potential of it being cancerous.  Patient reports that when he has intercourse, the tip of his penis hurts.  Patient has been reassured that the lump is a result of the surgery, though he reports that he feels like it goes 'lower' or lacking continuation.  Patient was told there would be scarring, but he might also develop plaques which could hurt for a while.  The plaques might or might not go away.  Patient was counseled to contact the clinic if his penis starts having curvature.  Patient was counseled to take vitamin E 400 IU daily to soften the plaques.  PMH: Past Medical History:  Diagnosis Date  . Hyperlipidemia   . Hyperlipidemia   . Hypertension     Surgical History: Past Surgical History:  Procedure Laterality Date  . CATARACT EXTRACTION    . CIRCUMCISION N/A 10/27/2017   Procedure: repair of penile fracture;  Surgeon: Riki Altes, MD;  Location: [facility] ORS;  Service:  Urology;  Laterality: N/A;    Home Medications:  Allergies as of 03/09/2018   No Known Allergies     Medication List       Accurate as of March 09, 2018 11:59 AM. Always use your most recent med list.        gabapentin 300 MG capsule Commonly known as:  NEURONTIN Take 300 mg by mouth every evening.   lisinopril 20 MG tablet Commonly known as:  PRINIVIL,ZESTRIL Take 10 mg by mouth daily.   metoprolol succinate 100 MG 24 hr tablet Commonly known as:  TOPROL-XL Take 100 mg by mouth daily.   nystatin cream Commonly known as:  MYCOSTATIN Apply topically.   pramipexole 0.125 MG tablet Commonly known as:  MIRAPEX Take 1 tab at night for a week then increase to 2 tabs at night and continue that dose   pregabalin 75 MG capsule Commonly known as:  LYRICA Take 75 mg by mouth 2 (two) times daily.   simvastatin 20 MG tablet Commonly known as:  ZOCOR Take 20 mg by mouth every evening.   sulfamethoxazole-trimethoprim 800-160 MG tablet Commonly known as:  BACTRIM DS,SEPTRA DS Take 1 tablet by mouth 2 (two) times daily.   tamsulosin 0.4 MG Caps capsule Commonly known as:  FLOMAX Take 1 capsule (0.4 mg total) by mouth daily.   topiramate 25 MG tablet Commonly known as:  TOPAMAX Take by mouth.       Allergies: No Known Allergies  Family History: Family History  Problem Relation Age of Onset  . Prostate cancer Neg Hx   . Bladder Cancer Neg Hx   . Kidney cancer Neg Hx     Social History:  reports that he has never smoked. He has never used smokeless tobacco. He reports that he does not drink alcohol or use drugs.  ROS: UROLOGY Frequent Urination?: No Hard to postpone urination?: No Burning/pain with urination?: No Get up at night to urinate?: No Leakage of urine?: No Urine stream starts and stops?: No Trouble starting stream?: No Do you have to strain to urinate?: No Blood in urine?: No Urinary tract infection?: No Sexually transmitted disease?:  No Injury to kidneys or bladder?: No Painful intercourse?: No Weak stream?: No Erection problems?: No Penile pain?: No  Gastrointestinal Nausea?: No Vomiting?: No Indigestion/heartburn?: No Diarrhea?: No Constipation?: No  Constitutional Fever: No Night sweats?: No Weight loss?: No Fatigue?: No  Skin Skin rash/lesions?: No Itching?: No  Eyes Blurred vision?: Yes Double vision?: No  Ears/Nose/Throat Sore throat?: No Sinus problems?: Yes  Hematologic/Lymphatic Swollen glands?: No Easy bruising?: No  Cardiovascular Leg swelling?: No Chest pain?: No  Respiratory Cough?: No Shortness of breath?: No  Endocrine Excessive thirst?: No  Musculoskeletal Back pain?: No Joint pain?: Yes  Neurological Headaches?: No Dizziness?: No  Psychologic Depression?: No Anxiety?: Yes  Physical Exam: BP 125/79   Pulse 66   Ht 5\' 7"  (1.702 m)   Wt 196 lb (88.9 kg)   BMI 30.70 kg/m   Constitutional:  Well nourished. Alert and oriented, No acute distress. Cardiovascular: No clubbing, cyanosis, or edema. Respiratory: Normal respiratory effort, no increased work of breathing. GU: No CVA tenderness.  No bladder fullness or masses.  Patient with uncircumcised phallus.  Foreskin easily retracted.  Urethral meatus is patent.  No penile discharge. No penile lesions or rashes. Plaques at the coronal ridge and induration at the right base of his penis which is consistent with the surgical repair.  Scrotum without lesions, cysts, rashes and/or edema.  Testicles are located scrotally bilaterally. No masses are appreciated in the testicles. Left and right epididymis are normal. Rectal: Patient with normal sphincter tone. Anus and perineum without scarring or rashes. No rectal masses are appreciated. Prostate is approximately 45 grams, no nodules are appreciated. Seminal vesicles are normal. Skin: No rashes, bruises or suspicious lesions. Neurologic: Grossly intact, no focal deficits,  moving all 4 extremities. Psychiatric: Normal mood and affect.  Laboratory Data:  Lab Results  Component Value Date   CREATININE 0.95 09/06/2017   I have reviewed the labs.  Pertinent Imaging: Non contrast CT in 10/2013 was negative Scrotal ultrasound in 03/2016 was negative CTU in 10/2017 was negative  Assessment & Plan:    1. History of hematuria - Hematuria work up completed in 10/2017 - findings positive for mild lateral lobe enlargement prostate with prominent hypervascularity and friability - No report of gross hematuria  - no hematuria seen on today's UA   2. Penile fracture  - S/P repair of right corpus cavernosum on 10/27/2017 - doing well  - Patient is given reassurance that this is the normal healing process for penile fracture - He will contact our office if he should notice any pain or bend to the penis during erections - Patient advised to take vitamin E, 400 IU daily to soften plaque - RTC in 6 months for recheck  Return in about 6 months (around 09/07/2018) for IPSS, SHIM and exam.  These notes generated  with voice recognition software. I apologize for typographical errors.  Duanne Moron  [facility] [address]  [place], [place] [address] [phone]  I, Duanne Moron, am acting as a Neurosurgeon for [employer], PA-C.   I have reviewed the above documentation for accuracy and completeness, and I agree with the above.    Michiel Cowboy, PA-C

## 2018-03-09 ENCOUNTER — Encounter: Payer: Self-pay | Admitting: Urology

## 2018-03-09 ENCOUNTER — Ambulatory Visit (INDEPENDENT_AMBULATORY_CARE_PROVIDER_SITE_OTHER): Payer: BLUE CROSS/BLUE SHIELD | Admitting: Urology

## 2018-03-09 VITALS — BP 125/79 | HR 66 | Ht 67.0 in | Wt 196.0 lb

## 2018-03-09 DIAGNOSIS — N138 Other obstructive and reflux uropathy: Secondary | ICD-10-CM

## 2018-03-09 DIAGNOSIS — N401 Enlarged prostate with lower urinary tract symptoms: Secondary | ICD-10-CM | POA: Diagnosis not present

## 2018-03-09 LAB — URINALYSIS, COMPLETE
Bilirubin, UA: NEGATIVE
Glucose, UA: NEGATIVE
Ketones, UA: NEGATIVE
LEUKOCYTES UA: NEGATIVE
NITRITE UA: NEGATIVE
PH UA: 5.5 (ref 5.0–7.5)
PROTEIN UA: NEGATIVE
SPEC GRAV UA: 1.02 (ref 1.005–1.030)
Urobilinogen, Ur: 0.2 mg/dL (ref 0.2–1.0)

## 2018-03-09 NOTE — Patient Instructions (Signed)
Vitamin E 400 I.U.

## 2018-04-06 IMAGING — CR DG CHEST 2V
2 series · 2 of 2 positions shown · non-contrast
Comparison: 10/31/2013

CLINICAL DATA: Left-sided chest pain

EXAM:
CHEST  2 VIEW

[chest pa]
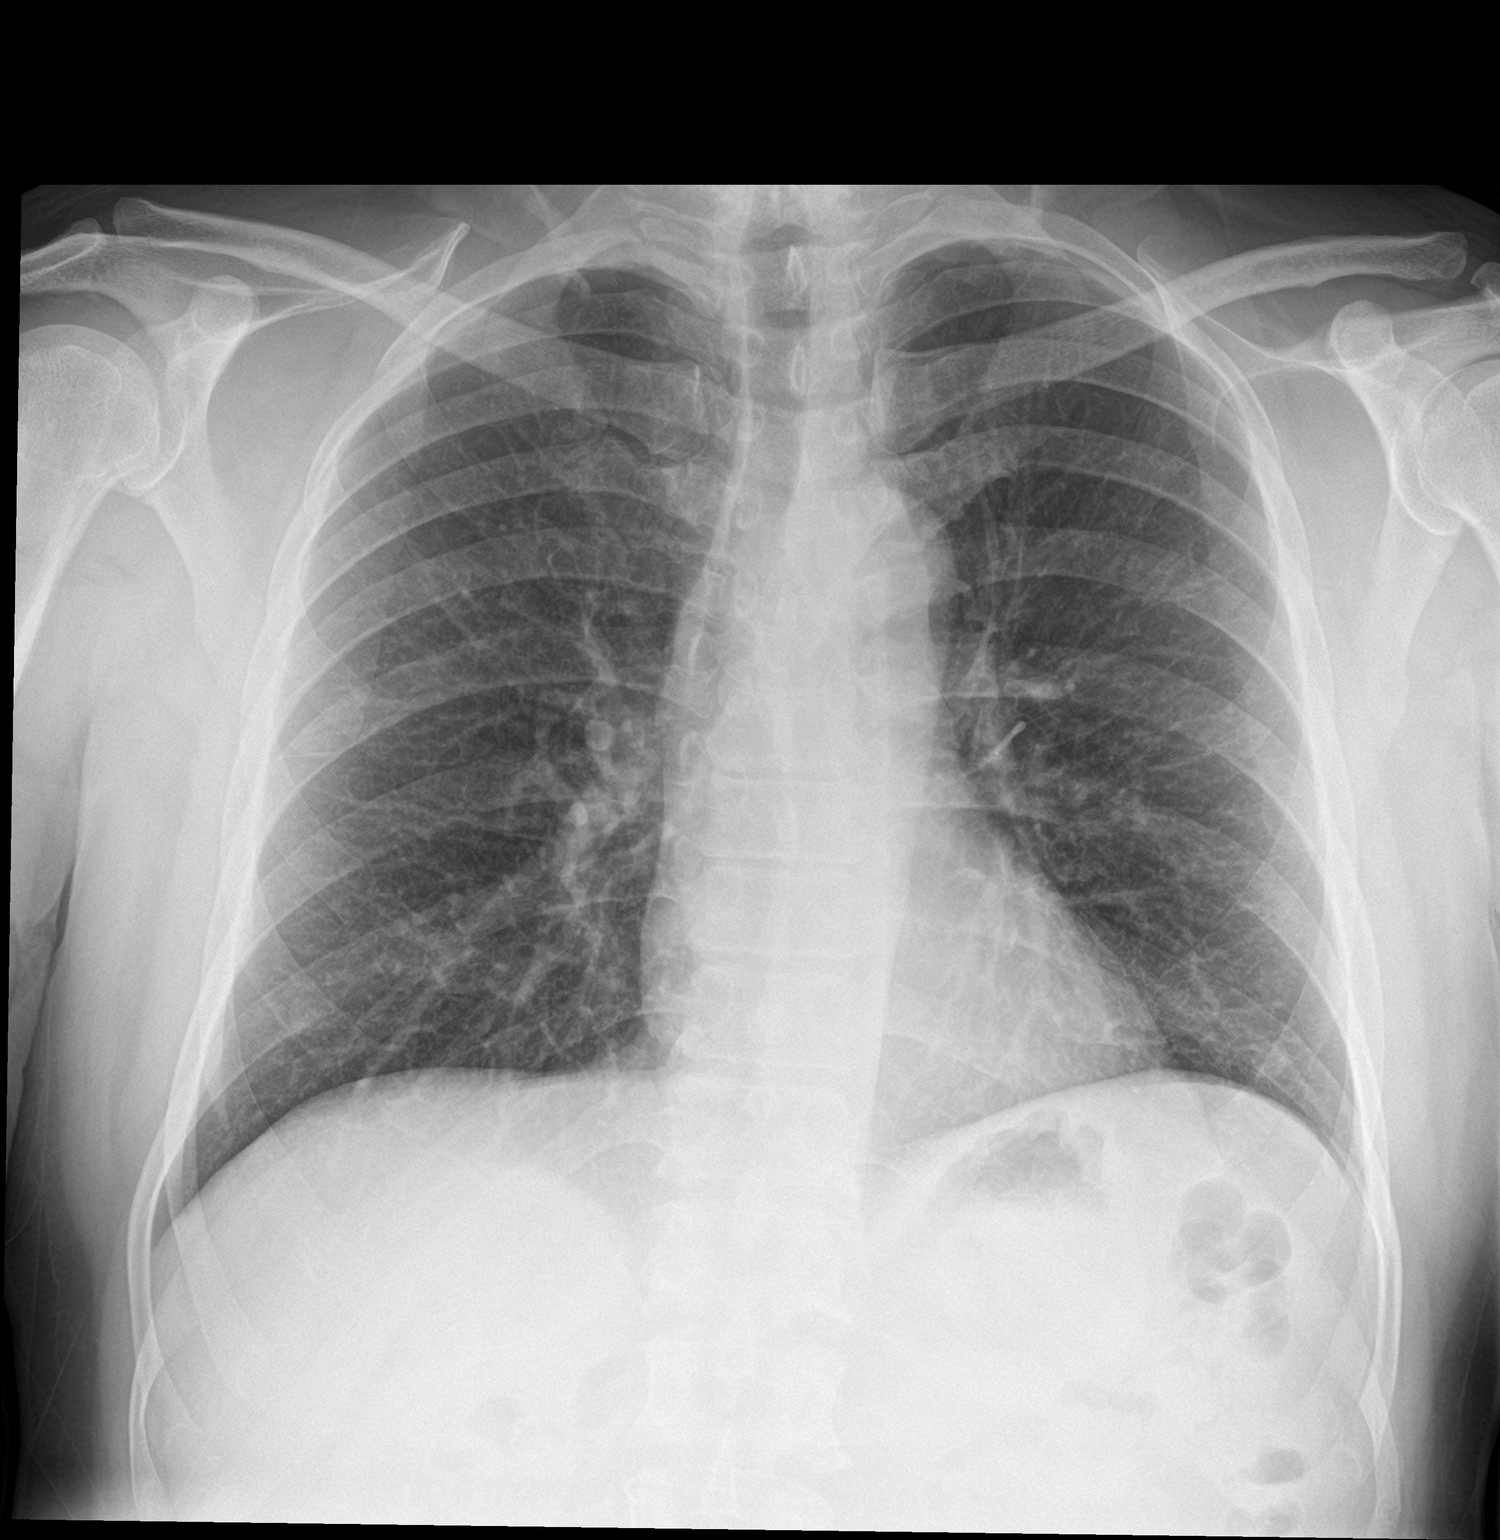

[chest lat]
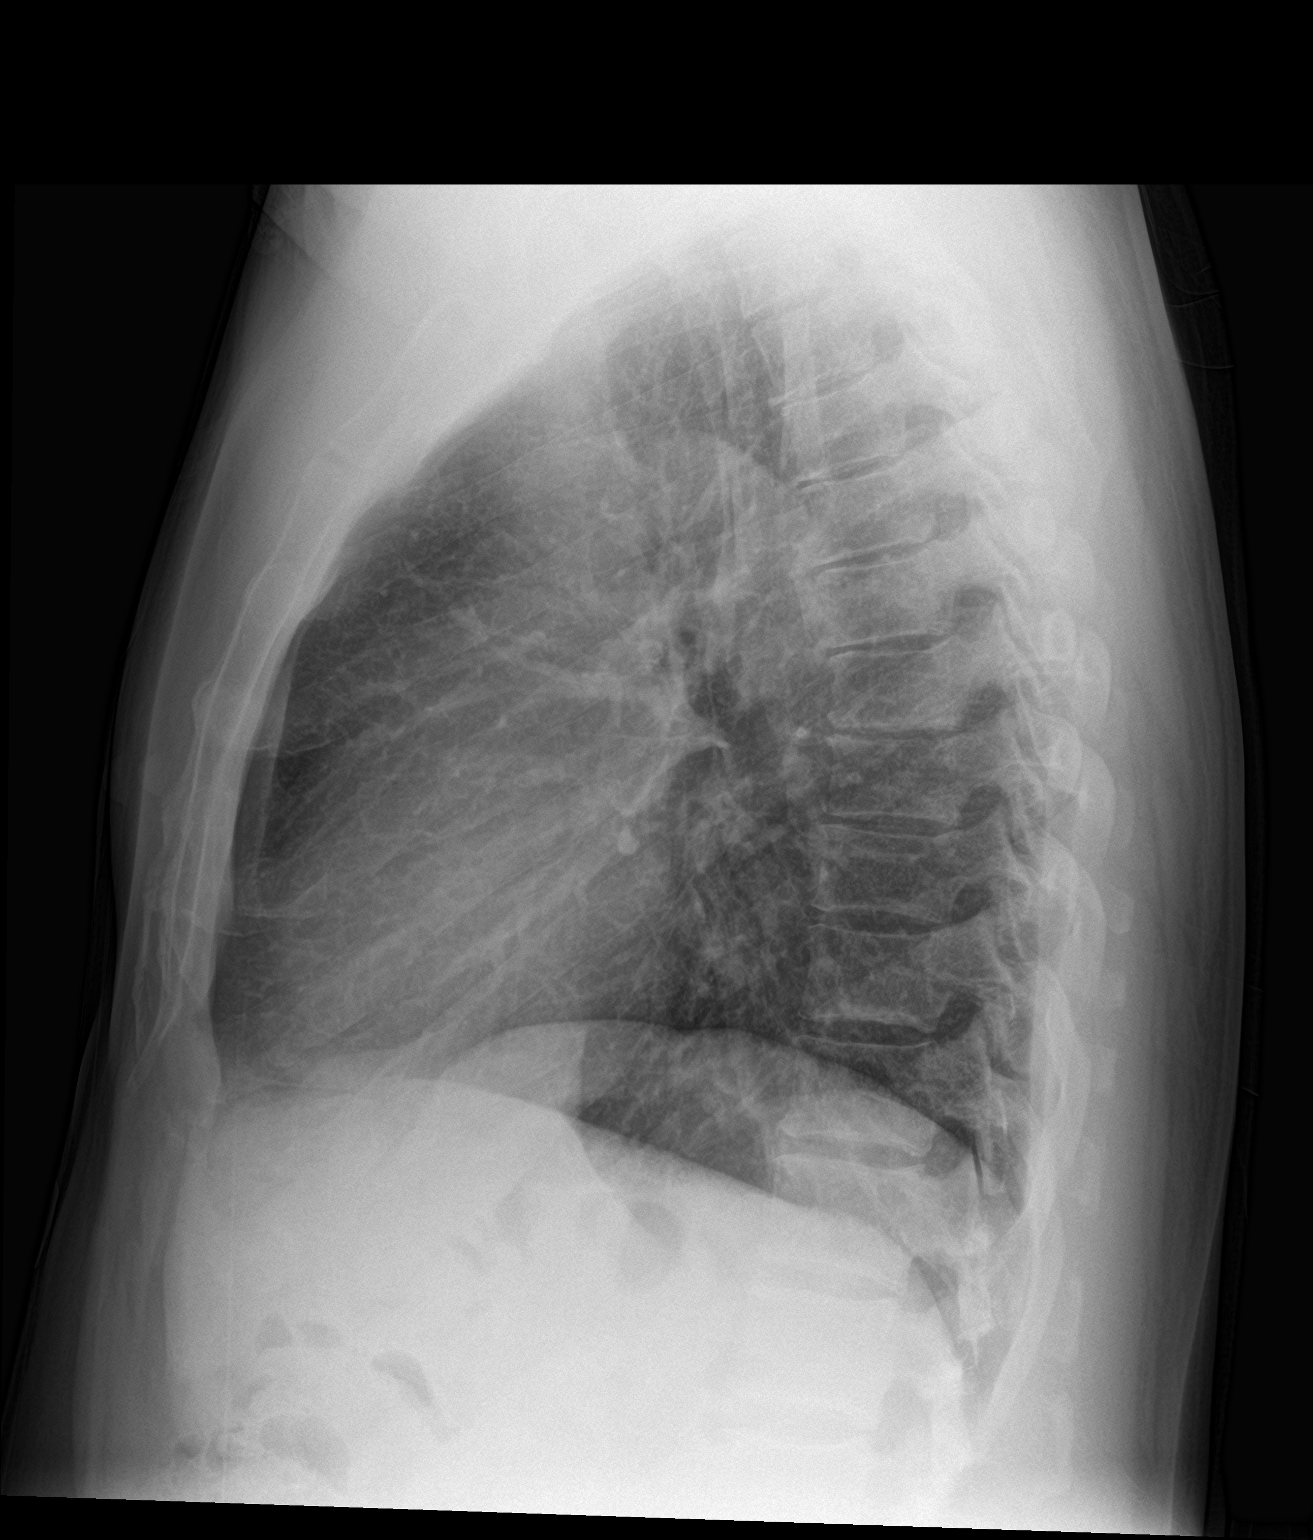

[2 of 2 positions shown; findings below may reference images not displayed]

FINDINGS: The heart size and mediastinal contours are within normal limits.
Both lungs are clear. The visualized skeletal structures are
unremarkable.
IMPRESSION: No active cardiopulmonary disease.

## 2018-04-11 IMAGING — CR DG CHEST 2V
1 series · 2 of 2 positions shown · non-contrast
Comparison: Chest radiograph 05/11/2016

CLINICAL DATA: Chest pain

EXAM:
CHEST  2 VIEW

[Series 1: dg chest 2 view · 0.14mm/px · 2 of 2 slices shown]
[im 1/2]
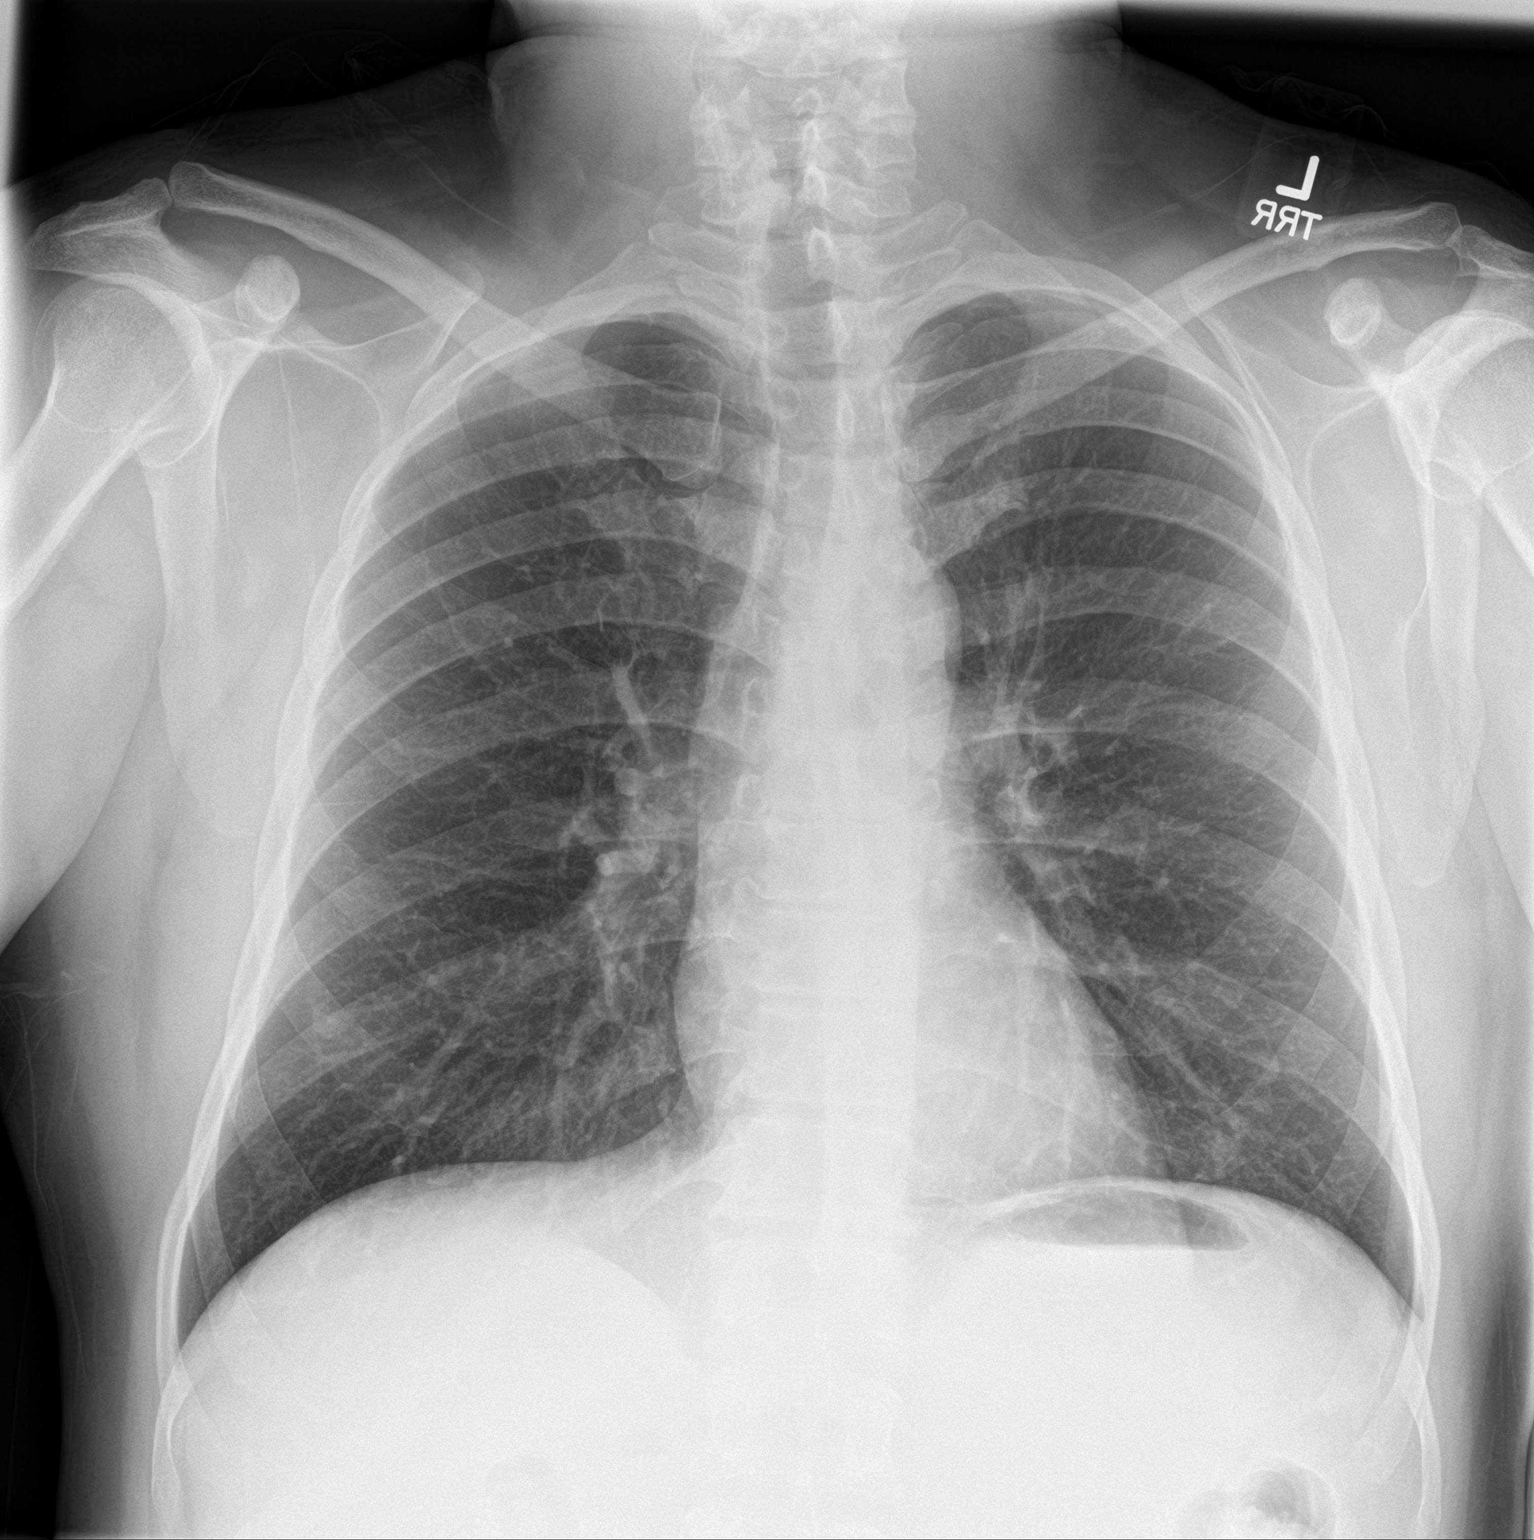
[im 2/2]
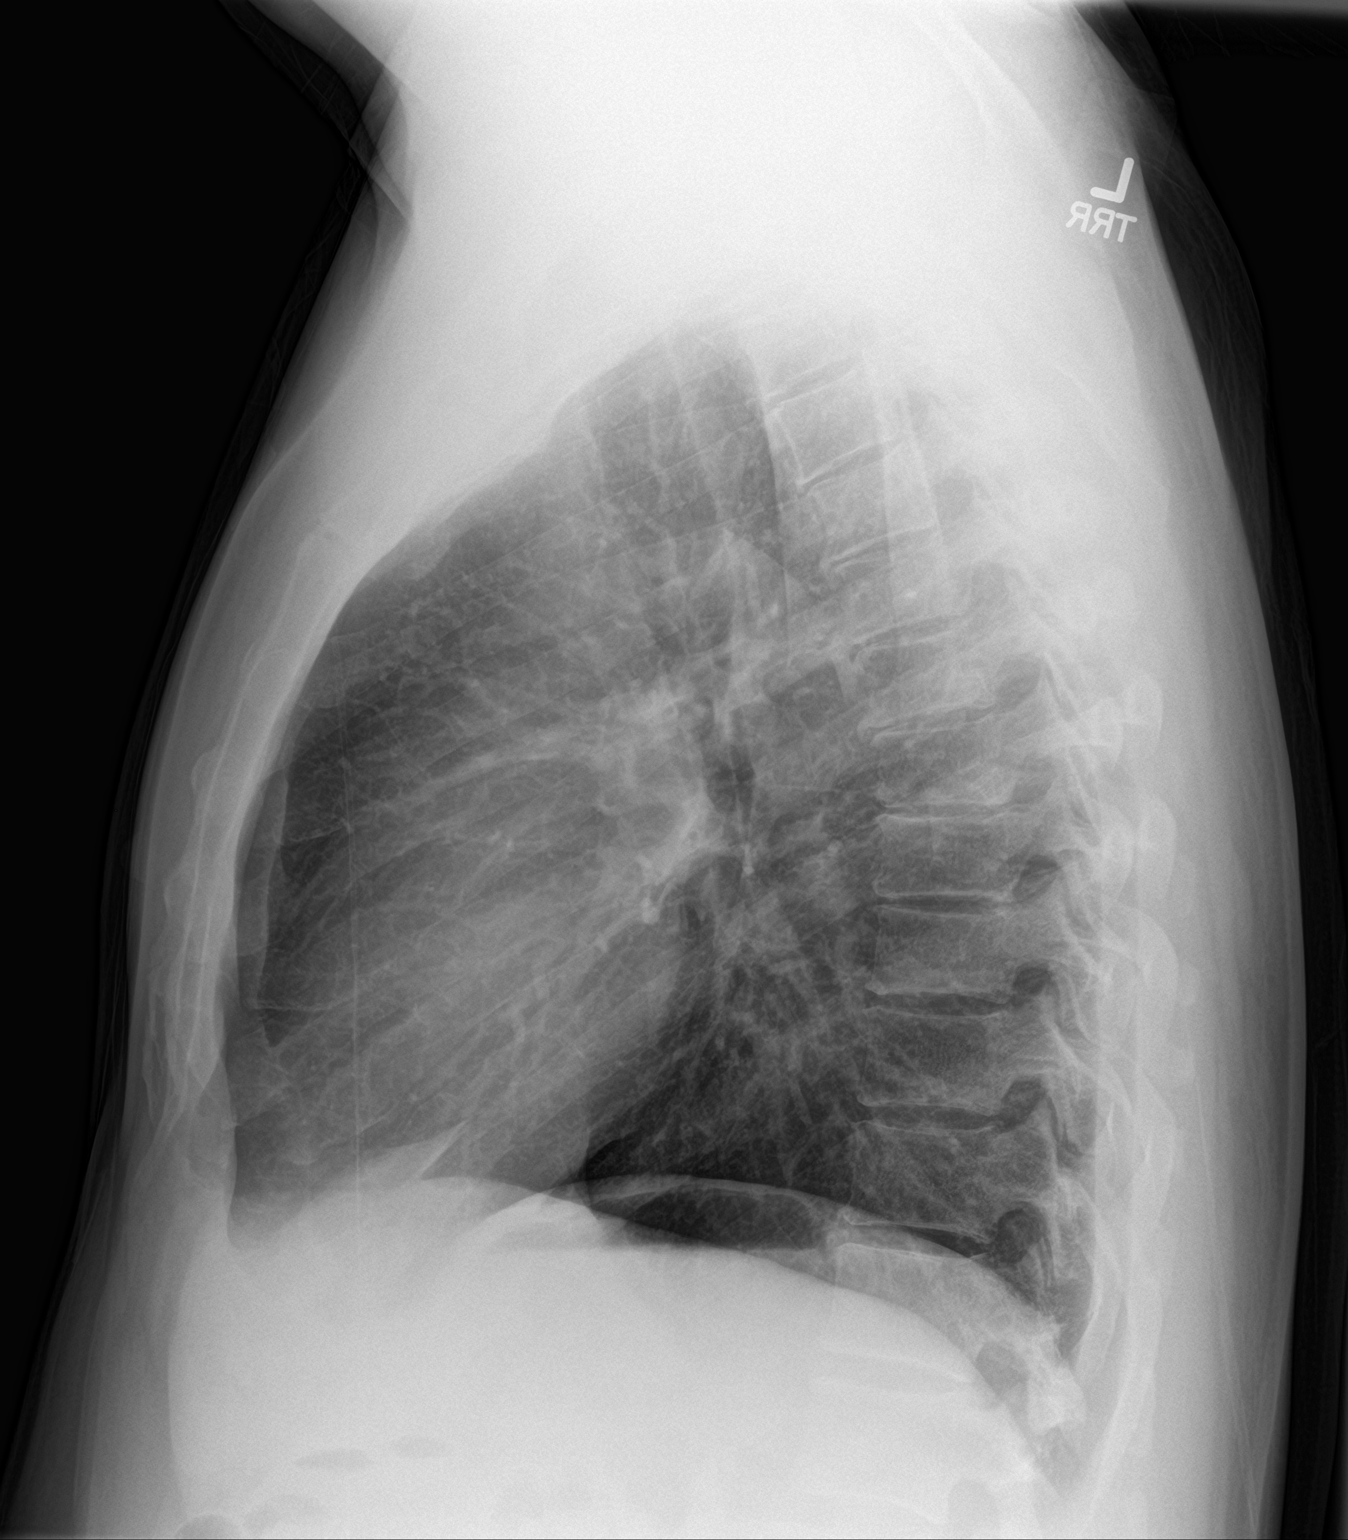

[2 of 2 positions shown; findings below may reference images not displayed]

FINDINGS: The heart size and mediastinal contours are within normal limits.
Both lungs are clear. The visualized skeletal structures are
unremarkable.
IMPRESSION: No active cardiopulmonary disease.

## 2018-08-31 ENCOUNTER — Other Ambulatory Visit: Payer: Self-pay

## 2018-08-31 DIAGNOSIS — Z20822 Contact with and (suspected) exposure to covid-19: Secondary | ICD-10-CM

## 2018-09-03 LAB — NOVEL CORONAVIRUS, NAA: SARS-CoV-2, NAA: NOT DETECTED

## 2018-09-06 ENCOUNTER — Other Ambulatory Visit: Payer: Self-pay

## 2018-09-12 NOTE — Progress Notes (Deleted)
03/09/2018  1:03 PM   Thomas Gaines 1965-09-07 [id number]  Referring provider: Barbette ReichmannHande, Vishwanath, MD [address] [facility] [place],  [place] [id number]  No chief complaint on file.   HPI: Thomas Gaines is a 53 y.o. male with a history of hematuria, BPH with LUTS and penile fracture who presents today for follow up.    History of hematuria (high risk) Non smoker.  CTU in 10/2017 was negative.  Cystoscopy with Dr. Lonna CobbStoioff in 10/2017 revealed mild lateral lobe enlargement prostate with prominent hypervascularity and friability.  Mild bladder neck elevation.  ***    BPH WITH LUTS  (prostate and/or bladder) IPSS score: *** PVR: ***   Previous score: ***   Previous PVR: ***   Major complaint(s):  x *** years. Denies any dysuria, hematuria or suprapubic pain.   Currently taking: ***.  His has had ***.   Denies any recent fevers, chills, nausea or vomiting.  He has a family history of PCa, with ***.   He does not have a family history of PCa.***    Score:  1-7 Mild 8-19 Moderate 20-35 Severe   History of penile fracture Patient underwent a penile exploration and repair of a tear of right corpus cavernosum on 10/27/2017.  Post-procedural course was as expected and non eventful.      PMH: Past Medical History:  Diagnosis Date  . Hyperlipidemia   . Hyperlipidemia   . Hypertension     Surgical History: Past Surgical History:  Procedure Laterality Date  . CATARACT EXTRACTION    . CIRCUMCISION N/A 10/27/2017   Procedure: repair of penile fracture;  Surgeon: Riki AltesStoioff, Scott C, MD;  Location: [facility] ORS;  Service: Urology;  Laterality: N/A;    Home Medications:  Allergies as of 09/13/2018   No Known Allergies     Medication List       Accurate as of September 12, 2018  1:03 PM. If you have any questions, ask your nurse or doctor.        gabapentin 300 MG capsule Commonly known as: NEURONTIN Take 300 mg by mouth every evening.    lisinopril 20 MG tablet Commonly known as: ZESTRIL Take 10 mg by mouth daily.   metoprolol succinate 100 MG 24 hr tablet Commonly known as: TOPROL-XL Take 100 mg by mouth daily.   nystatin cream Commonly known as: MYCOSTATIN Apply topically.   pramipexole 0.125 MG tablet Commonly known as: MIRAPEX Take 1 tab at night for a week then increase to 2 tabs at night and continue that dose   pregabalin 75 MG capsule Commonly known as: LYRICA Take 75 mg by mouth 2 (two) times daily.   simvastatin 20 MG tablet Commonly known as: ZOCOR Take 20 mg by mouth every evening.   sulfamethoxazole-trimethoprim 800-160 MG tablet Commonly known as: BACTRIM DS Take 1 tablet by mouth 2 (two) times daily.   tamsulosin 0.4 MG Caps capsule Commonly known as: FLOMAX Take 1 capsule (0.4 mg total) by mouth daily.   topiramate 25 MG tablet Commonly known as: TOPAMAX Take by mouth.       Allergies: No Known Allergies  Family History: Family History  Problem Relation Age of Onset  . Prostate cancer Neg Hx   . Bladder Cancer Neg Hx   . Kidney cancer Neg Hx     Social History:  reports that he has never smoked. He has never used smokeless tobacco. He reports that he does not drink alcohol or use  drugs.  ROS:                                        Physical Exam: There were no vitals taken for this visit.  Constitutional:  Well nourished. Alert and oriented, No acute distress. HEENT: [place] AT, moist mucus membranes.  Trachea midline, no masses. Cardiovascular: No clubbing, cyanosis, or edema. Respiratory: Normal respiratory effort, no increased work of breathing. GI: Abdomen is soft, non tender, non distended, no abdominal masses. Liver and spleen not palpable.  No hernias appreciated.  Stool sample for occult testing is not indicated.   GU: No CVA tenderness.  No bladder fullness or masses.  Patient with circumcised/uncircumcised phallus. ***Foreskin easily  retracted***  Urethral meatus is patent.  No penile discharge. No penile lesions or rashes. Scrotum without lesions, cysts, rashes and/or edema.  Testicles are located scrotally bilaterally. No masses are appreciated in the testicles. Left and right epididymis are normal. Rectal: Patient with  normal sphincter tone. Anus and perineum without scarring or rashes. No rectal masses are appreciated. Prostate is approximately *** grams, *** nodules are appreciated. Seminal vesicles are normal. Skin: No rashes, bruises or suspicious lesions. Lymph: No cervical or inguinal adenopathy. Neurologic: Grossly intact, no focal deficits, moving all 4 extremities. Psychiatric: Normal mood and affect.  Laboratory Data:  Lab Results  Component Value Date   CREATININE 0.95 09/06/2017   I have reviewed the labs.  Pertinent Imaging: Non contrast CT in 10/2013 was negative Scrotal ultrasound in 03/2016 was negative CTU in 10/2017 was negative  Assessment & Plan:    1. History of hematuria - Hematuria work up completed in 10/2017 - findings positive for mild lateral lobe enlargement prostate with prominent hypervascularity and friability - No report of gross hematuria  - no hematuria seen on today's UA   2. Penile fracture  - S/P repair of right corpus cavernosum on 10/27/2017 - doing well  - Patient is given reassurance that this is the normal healing process for penile fracture - He will contact our office if he should notice any pain or bend to the penis during erections - Patient advised to take vitamin E, 400 IU daily to soften plaque - RTC in 6 months for recheck  3. BPH with LUTS IPSS score is ***, it is stable/improving/worsening Continue conservative management, avoiding bladder irritants and timed voiding's Most bothersome symptoms is/are *** Initiate alpha-blocker (***), discussed side effects *** Initiate 5 alpha reductase inhibitor (***), discussed side effects *** Continue tamsulosin  0.4 mg daily, alfuzosin 10 mg daily, Rapaflo 8 mg daily, terazosin, doxazosin, Cialis 5 mg daily and finasteride 5 mg daily, dutasteride 0.5 mg daily***:refills given Cannot tolerate medication or medication failure, schedule cystoscopy *** RTC in *** months for IPSS, PSA, PVR and exam      No follow-ups on file.  These notes generated with voice recognition software. I apologize for typographical errors.  Zara Council, PA-C  [facility] [address] [place]  [place],  [address] [phone]

## 2018-09-13 ENCOUNTER — Ambulatory Visit: Payer: BLUE CROSS/BLUE SHIELD | Admitting: Urology

## 2018-09-13 ENCOUNTER — Encounter: Payer: Self-pay | Admitting: Urology

## 2018-10-16 ENCOUNTER — Telehealth: Payer: Self-pay | Admitting: Urology

## 2018-10-16 NOTE — Telephone Encounter (Signed)
Would you call Mr. Thomas Gaines and have him schedule a follow up for hematuria?

## 2019-01-09 ENCOUNTER — Encounter: Payer: Self-pay | Admitting: Urology

## 2019-01-10 ENCOUNTER — Encounter: Payer: Self-pay | Admitting: Urology

## 2019-01-24 ENCOUNTER — Ambulatory Visit: Payer: BC Managed Care – PPO | Admitting: Urology

## 2019-02-13 NOTE — Progress Notes (Signed)
03/09/2018  4:32 PM   Thomas Gaines [birth year] [id number]  Referring provider: Barbette Reichmann, MD [address] [facility] [place],  [place] [address]  Chief Complaint  Patient presents with  . Hematuria    HPI: Thomas Gaines is a 54 y.o. male with a history of hematuria, BPH with LUTS and penile fracture who presents today for follow up with interpreter, Orson Slick.    History of hematuria (high risk) Non-smoker.  CTU in 10/2017 revealed no cause for hematuria.  Cystoscopy in 10/2017 revealed an enlarged friable prostate.  He denies any gross hematuria.  UA 02/14/2019 negative.    BPH WITH LUTS  (prostate and/or bladder) IPSS score: 7/1    Previous PVR: 0 mL  Major complaint(s):  Frequency, urgency, intermittency, straining to urinate and a weak urinary stream.  He does not consider them bothersome.  Denies any dysuria, hematuria or suprapubic pain.   Currently taking: tamsulosin 0.4 mg daily, but it is causing retrograde ejaculation and he is wanting to discontinue the medication.   Denies any recent fevers, chills, nausea or vomiting.  He does not have a family history of PCa.  IPSS    Row Name 02/14/19 1500         International Prostate Symptom Score   How often have you had the sensation of not emptying your bladder?  Not at All     How often have you had to urinate less than every two hours?  Less than half the time     How often have you found you stopped and started again several times when you urinated?  Less than 1 in 5 times     How often have you found it difficult to postpone urination?  Less than 1 in 5 times     How often have you had a weak urinary stream?  Less than 1 in 5 times     How often have you had to strain to start urination?  Less than 1 in 5 times     How many times did you typically get up at night to urinate?  1 Time     Total IPSS Score  7       Quality of Life due to urinary symptoms   If you were to  spend the rest of your life with your urinary condition just the way it is now how would you feel about that?  Pleased        Score:  1-7 Mild 8-19 Moderate 20-35 Severe   Penile fracture Patient underwent a penile exploration and repair of a tear of right corpus cavernosum on 10/27/2017.   Doing well.  No penile pain or curvatures.  No ED.    PMH: Past Medical History:  Diagnosis Date  . Hyperlipidemia   . Hyperlipidemia   . Hypertension     Surgical History: Past Surgical History:  Procedure Laterality Date  . CATARACT EXTRACTION    . CIRCUMCISION N/A 10/27/2017   Procedure: repair of penile fracture;  Surgeon: Riki Altes, MD;  Location: [facility] ORS;  Service: Urology;  Laterality: N/A;    Home Medications:  Allergies as of 02/14/2019   No Known Allergies     Medication List       Accurate as of February 14, 2019  4:32 PM. If you have any questions, ask your nurse or doctor.        STOP taking these medications   sulfamethoxazole-trimethoprim 800-160 MG  tablet Commonly known as: BACTRIM DS Stopped by: Jehan Bonano, PA-C   topiramate 25 MG tablet Commonly known as: TOPAMAX Stopped by: Cing [place], PA-C     TAKE these medications   gabapentin 300 MG capsule Commonly known as: NEURONTIN Take 300 mg by mouth every evening.   lisinopril 20 MG tablet Commonly known as: ZESTRIL Take 10 mg by mouth daily.   metoprolol succinate 100 MG 24 hr tablet Commonly known as: TOPROL-XL Take 100 mg by mouth daily.   pramipexole 0.125 MG tablet Commonly known as: MIRAPEX Take 1 tab at night for a week then increase to 2 tabs at night and continue that dose   pregabalin 75 MG capsule Commonly known as: LYRICA Take 75 mg by mouth 2 (two) times daily.   simvastatin 20 MG tablet Commonly known as: ZOCOR Take 20 mg by mouth every evening.   tamsulosin 0.4 MG Caps capsule Commonly known as: FLOMAX Take 1 capsule (0.4 mg total) by mouth daily.        Allergies: No Known Allergies  Family History: Family History  Problem Relation Age of Onset  . Prostate cancer Neg Hx   . Bladder Cancer Neg Hx   . Kidney cancer Neg Hx     Social History:  reports that he has never smoked. He has never used smokeless tobacco. He reports that he does not drink alcohol or use drugs.  ROS: UROLOGY Frequent Urination?: Yes Hard to postpone urination?: Yes Burning/pain with urination?: No Get up at night to urinate?: No Leakage of urine?: No Urine stream starts and stops?: No Trouble starting stream?: Yes Do you have to strain to urinate?: Yes Blood in urine?: No Urinary tract infection?: No Sexually transmitted disease?: No Injury to kidneys or bladder?: No Painful intercourse?: No Weak stream?: Yes Erection problems?: No Penile pain?: No  Gastrointestinal Nausea?: No Vomiting?: No Indigestion/heartburn?: No Diarrhea?: No Constipation?: No  Constitutional Fever: No Night sweats?: No Weight loss?: No Fatigue?: No  Skin Skin rash/lesions?: Yes Itching?: No  Eyes Blurred vision?: Yes Double vision?: No  Ears/Nose/Throat Sore throat?: No Sinus problems?: No  Hematologic/Lymphatic Swollen glands?: No Easy bruising?: Yes  Cardiovascular Leg swelling?: No Chest pain?: No  Respiratory Cough?: No Shortness of breath?: No  Endocrine Excessive thirst?: No  Musculoskeletal Back pain?: No Joint pain?: No  Neurological Headaches?: No Dizziness?: No  Psychologic Depression?: No Anxiety?: No  Physical Exam: There were no vitals taken for this visit.  Constitutional:  Well nourished. Alert and oriented, No acute distress. HEENT: [place] AT, mask in place.  Trachea midline, no masses. Cardiovascular: No clubbing, cyanosis, or edema. Respiratory: Normal respiratory effort, no increased work of breathing. GI: Abdomen is soft, non tender, non distended, no abdominal masses. Liver and spleen not palpable.  No hernias  appreciated.  Stool sample for occult testing is not indicated.   GU: No CVA tenderness.  No bladder fullness or masses.  Patient with uncircumcised phallus.  Foreskin easily retracted.  Urethral meatus is patent.  No penile discharge. No penile lesions or rashes. Scrotum without lesions, cysts, rashes and/or edema.  Testicles are located scrotally bilaterally. No masses are appreciated in the testicles. Left and right epididymis are normal. Rectal: Patient with  normal sphincter tone. Anus and perineum without scarring or rashes. No rectal masses are appreciated. Prostate is approximately 50 grams, could only palpate the apex and midportion of the gland, no nodules are appreciated. Seminal vesicles could not be palpated Skin: No rashes, bruises or  suspicious lesions. Lymph: No inguinal adenopathy. Neurologic: Grossly intact, no focal deficits, moving all 4 extremities. Psychiatric: Normal mood and affect.  Laboratory Data:  Lab Results  Component Value Date   CREATININE 0.95 09/06/2017   I have reviewed the labs.  Pertinent Imaging: Non contrast CT in 10/2013 was negative Scrotal ultrasound in 03/2016 was negative CTU in 10/2017 was negative  Assessment & Plan:    1. History of hematuria - Hematuria work up completed in 10/2017 - findings positive for mild lateral lobe enlargement prostate with prominent hypervascularity and friability - No report of gross hematuria  - no hematuria seen on today's UA  RTC in one year for UA, to report any gross hematuria in the interim   2. Penile fracture  - S/P repair of right corpus cavernosum on 10/27/2017 - doing well   3. BPH with LUTS IPSS score is 7/1 Continue conservative management, avoiding bladder irritants and timed voiding's Discontinue the tamsulosin 0.4 mg daily and if urinary symptoms worsen, we can send in a script for tadalafil 5 mg daily RTC in 12 months for IPSS, PSA and exam   Return in about 1 year (around 02/14/2020) for  IPSS, PSA, UA and exam.  These notes generated with voice recognition software. I apologize for typographical errors.  Michiel Cowboy, PA-C  [facility] [address]  [place], [place] [address] [phone]

## 2019-02-14 ENCOUNTER — Ambulatory Visit: Payer: BC Managed Care – PPO | Admitting: Urology

## 2019-02-14 ENCOUNTER — Encounter: Payer: Self-pay | Admitting: Urology

## 2019-02-14 ENCOUNTER — Other Ambulatory Visit: Payer: Self-pay

## 2019-02-14 VITALS — BP 137/75 | HR 76 | Wt 199.3 lb

## 2019-02-14 DIAGNOSIS — N138 Other obstructive and reflux uropathy: Secondary | ICD-10-CM | POA: Diagnosis not present

## 2019-02-14 DIAGNOSIS — Z87448 Personal history of other diseases of urinary system: Secondary | ICD-10-CM | POA: Diagnosis not present

## 2019-02-14 DIAGNOSIS — S39840D Fracture of corpus cavernosum penis, subsequent encounter: Secondary | ICD-10-CM

## 2019-02-14 DIAGNOSIS — N401 Enlarged prostate with lower urinary tract symptoms: Secondary | ICD-10-CM

## 2019-02-15 LAB — URINALYSIS, COMPLETE
Bilirubin, UA: NEGATIVE
Glucose, UA: NEGATIVE
Ketones, UA: NEGATIVE
Leukocytes,UA: NEGATIVE
Nitrite, UA: NEGATIVE
Protein,UA: NEGATIVE
Specific Gravity, UA: 1.025 (ref 1.005–1.030)
Urobilinogen, Ur: 0.2 mg/dL (ref 0.2–1.0)
pH, UA: 6 (ref 5.0–7.5)

## 2019-02-15 LAB — MICROSCOPIC EXAMINATION
Bacteria, UA: NONE SEEN
Epithelial Cells (non renal): NONE SEEN /hpf (ref 0–10)
RBC: NONE SEEN /hpf (ref 0–2)

## 2019-11-05 ENCOUNTER — Ambulatory Visit: Payer: BC Managed Care – PPO | Attending: Neurology

## 2019-11-07 ENCOUNTER — Other Ambulatory Visit: Payer: Self-pay | Admitting: Internal Medicine

## 2019-11-07 DIAGNOSIS — R1084 Generalized abdominal pain: Secondary | ICD-10-CM

## 2019-11-25 ENCOUNTER — Ambulatory Visit: Payer: BC Managed Care – PPO | Attending: Neurology

## 2019-11-25 DIAGNOSIS — G4733 Obstructive sleep apnea (adult) (pediatric): Secondary | ICD-10-CM | POA: Insufficient documentation

## 2019-11-25 DIAGNOSIS — G4761 Periodic limb movement disorder: Secondary | ICD-10-CM | POA: Insufficient documentation

## 2019-11-26 ENCOUNTER — Other Ambulatory Visit: Payer: Self-pay

## 2019-12-03 ENCOUNTER — Ambulatory Visit: Payer: BC Managed Care – PPO

## 2019-12-19 ENCOUNTER — Ambulatory Visit
Admission: RE | Admit: 2019-12-19 | Discharge: 2019-12-19 | Disposition: A | Discharge status: Home / Self Care | Payer: BC Managed Care – PPO | Source: Ambulatory Visit | Attending: Internal Medicine | Admitting: Internal Medicine

## 2019-12-19 ENCOUNTER — Other Ambulatory Visit: Payer: Self-pay

## 2019-12-19 DIAGNOSIS — R1084 Generalized abdominal pain: Secondary | ICD-10-CM

## 2019-12-20 ENCOUNTER — Ambulatory Visit
Admission: RE | Admit: 2019-12-20 | Discharge: 2019-12-20 | Disposition: A | Discharge status: Home / Self Care | Payer: BC Managed Care – PPO | Source: Ambulatory Visit | Attending: Internal Medicine | Admitting: Internal Medicine

## 2019-12-20 ENCOUNTER — Other Ambulatory Visit: Payer: Self-pay

## 2019-12-20 DIAGNOSIS — R1084 Generalized abdominal pain: Secondary | ICD-10-CM | POA: Diagnosis not present

## 2020-02-19 NOTE — Progress Notes (Deleted)
03/09/2018  9:55 PM   Thomas Gaines [birth year] [id number]  Referring provider: Barbette Reichmann, MD [address] [facility] [place],  [place] [address]  No chief complaint on file.  Urological history 1. BPH with LU TS - I PSS:***    HPI: Thomas Gaines is a 55 y.o. male with a history of hematuria, BPH with LUTS and penile fracture who presents today for follow up with interpreter, Orson Slick.    History of hematuria (high risk) Non-smoker.  CTU in 10/2017 revealed no cause for hematuria.  Cystoscopy in 10/2017 revealed an enlarged friable prostate.  He denies any gross hematuria.  UA 02/14/2019 negative.    BPH WITH LUTS  (prostate and/or bladder) IPSS score: 7/1    Previous PVR: 0 mL  Major complaint(s):  Frequency, urgency, intermittency, straining to urinate and a weak urinary stream.  He does not consider them bothersome.  Denies any dysuria, hematuria or suprapubic pain.   Currently taking: tamsulosin 0.4 mg daily, but it is causing retrograde ejaculation and he is wanting to discontinue the medication.   Denies any recent fevers, chills, nausea or vomiting.  He does not have a family history of PCa.    Score:  1-7 Mild 8-19 Moderate 20-35 Severe   Penile fracture Patient underwent a penile exploration and repair of a tear of right corpus cavernosum on 10/27/2017.   Doing well.  No penile pain or curvatures.  No ED.    PMH: Past Medical History:  Diagnosis Date  . Hyperlipidemia   . Hyperlipidemia   . Hypertension     Surgical History: Past Surgical History:  Procedure Laterality Date  . CATARACT EXTRACTION    . CIRCUMCISION N/A 10/27/2017   Procedure: repair of penile fracture;  Surgeon: Riki Altes, MD;  Location: [facility] ORS;  Service: Urology;  Laterality: N/A;    Home Medications:  Allergies as of 02/20/2020   No Known Allergies     Medication List       Accurate as of February 19, 2020  9:55 PM. If you  have any questions, ask your nurse or doctor.        gabapentin 300 MG capsule Commonly known as: NEURONTIN Take 300 mg by mouth every evening.   lisinopril 20 MG tablet Commonly known as: ZESTRIL Take 10 mg by mouth daily.   metoprolol succinate 100 MG 24 hr tablet Commonly known as: TOPROL-XL Take 100 mg by mouth daily.   pramipexole 0.125 MG tablet Commonly known as: MIRAPEX Take 1 tab at night for a week then increase to 2 tabs at night and continue that dose   pregabalin 75 MG capsule Commonly known as: LYRICA Take 75 mg by mouth 2 (two) times daily.   simvastatin 20 MG tablet Commonly known as: ZOCOR Take 20 mg by mouth every evening.   tamsulosin 0.4 MG Caps capsule Commonly known as: FLOMAX Take 1 capsule (0.4 mg total) by mouth daily.       Allergies: No Known Allergies  Family History: Family History  Problem Relation Age of Onset  . Prostate cancer Neg Hx   . Bladder Cancer Neg Hx   . Kidney cancer Neg Hx     Social History:  reports that he has never smoked. He has never used smokeless tobacco. He reports that he does not drink alcohol and does not use drugs.  ROS:  Physical Exam: There were no vitals taken for this visit.  Constitutional:  Well nourished. Alert and oriented, No acute distress. HEENT: [place] AT, mask in place.  Trachea midline, no masses. Cardiovascular: No clubbing, cyanosis, or edema. Respiratory: Normal respiratory effort, no increased work of breathing. GI: Abdomen is soft, non tender, non distended, no abdominal masses. Liver and spleen not palpable.  No hernias appreciated.  Stool sample for occult testing is not indicated.   GU: No CVA tenderness.  No bladder fullness or masses.  Patient with uncircumcised phallus.  Foreskin easily retracted.  Urethral meatus is patent.  No penile discharge. No penile lesions or rashes. Scrotum without lesions, cysts, rashes and/or  edema.  Testicles are located scrotally bilaterally. No masses are appreciated in the testicles. Left and right epididymis are normal. Rectal: Patient with  normal sphincter tone. Anus and perineum without scarring or rashes. No rectal masses are appreciated. Prostate is approximately 50 grams, could only palpate the apex and midportion of the gland, no nodules are appreciated. Seminal vesicles could not be palpated Skin: No rashes, bruises or suspicious lesions. Lymph: No inguinal adenopathy. Neurologic: Grossly intact, no focal deficits, moving all 4 extremities. Psychiatric: Normal mood and affect.  Laboratory Data:  Lab Results  Component Value Date   CREATININE 0.95 09/06/2017   I have reviewed the labs.  Pertinent Imaging: Non contrast CT in 10/2013 was negative Scrotal ultrasound in 03/2016 was negative CTU in 10/2017 was negative  Assessment & Plan:    1. History of hematuria - Hematuria work up completed in 10/2017 - findings positive for mild lateral lobe enlargement prostate with prominent hypervascularity and friability - No report of gross hematuria  - no hematuria seen on today's UA  RTC in one year for UA, to report any gross hematuria in the interim   2. Penile fracture  - S/P repair of right corpus cavernosum on 10/27/2017 - doing well   3. BPH with LUTS IPSS score is 7/1 Continue conservative management, avoiding bladder irritants and timed voiding's Discontinue the tamsulosin 0.4 mg daily and if urinary symptoms worsen, we can send in a script for tadalafil 5 mg daily RTC in 12 months for IPSS, PSA and exam   No follow-ups on file.  These notes generated with voice recognition software. I apologize for typographical errors.  Michiel Cowboy, PA-C  [facility] [address]  [place], [place] [address] [phone]

## 2020-02-20 ENCOUNTER — Ambulatory Visit: Payer: BC Managed Care – PPO | Admitting: Urology

## 2020-03-12 ENCOUNTER — Ambulatory Visit: Payer: BC Managed Care – PPO | Admitting: Urology

## 2020-03-12 NOTE — Progress Notes (Deleted)
03/09/2018  8:47 AM   Thomas Gaines 1965-04-12 [id number]  Referring provider: Tracie Harrier, [place] [facility] [place],  [place] [address]  No chief complaint on file.  Urological history: 1. BPH with LU TS - I PSS:*** - PVR ***  2. High risk hematuria - non-smoker - CTU 2019 NED - cysto 2019 enlarged friable prostate - UA ***  3. Penile fracture - underwent a penile exploration and repair of a tear of right corpus cavernosum on 10/27/2017     HPI: Thomas Gaines is a 55 y.o. male who presents today for one year follow up.     Score:  1-7 Mild 8-19 Moderate 20-35 Severe     PMH: Past Medical History:  Diagnosis Date  . Hyperlipidemia   . Hyperlipidemia   . Hypertension     Surgical History: Past Surgical History:  Procedure Laterality Date  . CATARACT EXTRACTION    . CIRCUMCISION N/A 10/27/2017   Procedure: repair of penile fracture;  Surgeon: Abbie Sons, MD;  Location: [facility] ORS;  Service: Urology;  Laterality: N/A;    Home Medications:  Allergies as of 03/13/2020   No Known Allergies     Medication List       Accurate as of March 12, 2020  8:47 AM. If you have any questions, ask your nurse or doctor.        gabapentin 300 MG capsule Commonly known as: NEURONTIN Take 300 mg by mouth every evening.   lisinopril 20 MG tablet Commonly known as: ZESTRIL Take 10 mg by mouth daily.   metoprolol succinate 100 MG 24 hr tablet Commonly known as: TOPROL-XL Take 100 mg by mouth daily.   pramipexole 0.125 MG tablet Commonly known as: MIRAPEX Take 1 tab at night for a week then increase to 2 tabs at night and continue that dose   pregabalin 75 MG capsule Commonly known as: LYRICA Take 75 mg by mouth 2 (two) times daily.   simvastatin 20 MG tablet Commonly known as: ZOCOR Take 20 mg by mouth every evening.   tamsulosin 0.4 MG Caps capsule Commonly known as: FLOMAX Take 1 capsule  (0.4 mg total) by mouth daily.       Allergies: No Known Allergies  Family History: Family History  Problem Relation Age of Onset  . Prostate cancer Neg Hx   . Bladder Cancer Neg Hx   . Kidney cancer Neg Hx     Social History:  reports that he has never smoked. He has never used smokeless tobacco. He reports that he does not drink alcohol and does not use drugs.  For pertinent review of systems please refer to history of present illness  Physical Exam: There were no vitals taken for this visit.  Constitutional:  Well nourished. Alert and oriented, No acute distress. HEENT: [place] AT, moist mucus membranes.  Trachea midline Cardiovascular: No clubbing, cyanosis, or edema. Respiratory: Normal respiratory effort, no increased work of breathing. GI: Abdomen is soft, non tender, non distended, no abdominal masses. Liver and spleen not palpable.  No hernias appreciated.  Stool sample for occult testing is not indicated.   GU: No CVA tenderness.  No bladder fullness or masses.  Patient with circumcised/uncircumcised phallus. ***Foreskin easily retracted***  Urethral meatus is patent.  No penile discharge. No penile lesions or rashes. Scrotum without lesions, cysts, rashes and/or edema.  Testicles are located scrotally bilaterally. No masses are appreciated in the testicles. Left and right epididymis  are normal. Rectal: Patient with  normal sphincter tone. Anus and perineum without scarring or rashes. No rectal masses are appreciated. Prostate is approximately *** grams, *** nodules are appreciated. Seminal vesicles are normal. Skin: No rashes, bruises or suspicious lesions. Lymph: No inguinal adenopathy. Neurologic: Grossly intact, no focal deficits, moving all 4 extremities. Psychiatric: Normal mood and affect.  Laboratory Data: Specimen:  Urine  Ref Range & Units 7 d ago  Color Yellow, Violet, Light Violet, Dark Violet Yellow   Clarity Clear SL CloudyAbnormal   Specific Gravity 1.000 -  1.030 >=1.030   pH, Urine 5.0 - 8.0 5.5   Protein, Urinalysis Negative, Trace mg/dL Negative   Glucose, Urinalysis Negative mg/dL Negative   Ketones, Urinalysis Negative mg/dL Negative   Blood, Urinalysis Negative SmallAbnormal   Nitrite, Urinalysis Negative Negative   Leukocyte Esterase, Urinalysis Negative Negative   White Blood Cells, Urinalysis None Seen, 0-3 /hpf None Seen   Red Blood Cells, Urinalysis None Seen, 0-3 /hpf 0-3   Bacteria, Urinalysis None Seen /hpf RareAbnormal   Squamous Epithelial Cells, Urinalysis Rare, Few, None Seen /hpf None Seen   Resulting Agency  [facility] - [facility]  Specimen Collected: 03/05/20 7:04 AM Last Resulted: 03/05/20 8:24 AM  Received From: [place]  Result Received: 03/12/20 8:47 AM   Specimen:  Blood  Ref Range & Units 7 d ago  Thyroid Stimulating Hormone (TSH) 0.450-5.330 uIU/ml uIU/mL 1.374   Resulting Agency  [facility] - [facility]  Specimen Collected: 03/05/20 7:04 AM Last Resulted: 03/05/20 10:01 AM  Received From: [facility]  Result Received: 03/12/20 8:47 AM   Specimen:  Blood  Ref Range & Units 7 d ago  Cholesterol, Total 100 - 200 mg/dL 175   Triglyceride 35 - 199 mg/dL 159   HDL (High Density Lipoprotein) Cholesterol 29.0 - 71.0 mg/dL 34.1   LDL Calculated 0 - 130 mg/dL 109   VLDL Cholesterol mg/dL 32   Cholesterol/HDL Ratio  5.1   Resulting Agency  [facility] [facility] - [facility]  Specimen Collected: 03/05/20 7:04 AM Last Resulted: 03/05/20 8:52 AM  Received From: [facility]  Result Received: 03/12/20 8:47 AM   Specimen:  Blood  Ref Range & Units 7 d ago  Hemoglobin A1C 4.2 - 5.6 % 6.1High   Average Blood Glucose (Calc) mg/dL 128   Resulting Agency  [facility] - [facility]   Narrative  Normal Range:  4.2 - 5.6%  Increased Risk: 5.7 - 6.4%  Diabetes:    >= 6.5%  Glycemic Control for adults with diabetes: <7%  Specimen Collected:  03/05/20 7:04 AM Last Resulted: 03/05/20 9:10 AM  Received From: [place]  Result Received: 03/12/20 8:47 AM   Specimen:  Blood  Ref Range & Units 7 d ago  Glucose 70 - 110 mg/dL 93   Sodium 136 - 145 mmol/L 139   Potassium 3.6 - 5.1 mmol/L 4.4   Chloride 97 - 109 mmol/L 104   Carbon Dioxide (CO2) 22.0 - 32.0 mmol/L 30.9   Urea Nitrogen (BUN) 7 - 25 mg/dL 14   Creatinine 0.7 - 1.3 mg/dL 0.9   Glomerular Filtration Rate (eGFR), MDRD Estimate >60 mL/min/1.73sq m 88   Calcium 8.7 - 10.3 mg/dL 9.7   AST  8 - 39 U/L 22   ALT  6 - 57 U/L 30   Alk Phos (alkaline Phosphatase) 34 - 104 U/L 93   Albumin 3.5 - 4.8 g/dL  4.4   Bilirubin, Total 0.3 - 1.2 mg/dL 0.8   Protein, Total 6.1 - 7.9 g/dL 8.0High   A/G Ratio 1.0 - 5.0 gm/dL 1.2   Resulting Agency  [facility] - [facility]  Specimen Collected: 03/05/20 7:04 AM Last Resulted: 03/05/20 8:52 AM  Received From: [facility]  Result Received: 03/12/20 8:47 AM   Specimen:  Blood  Ref Range & Units 7 d ago  WBC (White Blood Cell Count) 4.1 - 10.2 10^3/uL 9.3   RBC (Red Blood Cell Count) 4.69 - 6.13 10^6/uL 4.97   Hemoglobin 14.1 - 18.1 gm/dL 15.6   Hematocrit 40.0 - 52.0 % 46.1   MCV (Mean Corpuscular Volume) 80.0 - 100.0 fl 92.8   MCH (Mean Corpuscular Hemoglobin) 27.0 - 31.2 pg 31.4High   MCHC (Mean Corpuscular Hemoglobin Concentration) 32.0 - 36.0 gm/dL 33.8   Platelet Count 150 - 450 10^3/uL 276   RDW-CV (Red Cell Distribution Width) 11.6 - 14.8 % 12.2   MPV (Mean Platelet Volume) 9.4 - 12.4 fl 10.8   Neutrophils 1.50 - 7.80 10^3/uL 4.49   Lymphocytes 1.00 - 3.60 10^3/uL 3.42   Monocytes 0.00 - 1.50 10^3/uL 1.04   Eosinophils 0.00 - 0.55 10^3/uL 0.23   Basophils 0.00 - 0.09 10^3/uL 0.08   Neutrophil % 32.0 - 70.0 % 48.3   Lymphocyte % 10.0 - 50.0 % 36.9   Monocyte % 4.0 - 13.0 % 11.2   Eosinophil % 1.0 - 5.0 % 2.5   Basophil% 0.0 - 2.0 % 0.9   Immature Granulocyte % <=0.7 % 0.2    Immature Granulocyte Count <=0.06 10^3/L 0.02   Resulting Agency  [place] - [facility]  Specimen Collected: 03/05/20 7:04 AM Last Resulted: 03/05/20 8:24 AM  Received From: [place]  Result Received: 03/12/20 8:47 AM  I have reviewed the labs.  Pertinent Imaging: ***   Assessment & Plan:    1. BPH with LUTS - IPSS score is 7/1 - Continue conservative management, avoiding bladder irritants and timed voiding's - Discontinue the tamsulosin 0.4 mg daily and if urinary symptoms worsen, we can send in a script for tadalafil 5 mg daily - RTC in 12 months for IPSS, PSA and exam   2. History of hematuria - Hematuria work up completed in 10/2017 - findings positive for mild lateral lobe enlargement prostate with prominent hypervascularity and friability - No report of gross hematuria  - No micro heme in 02/2020 - RTC in one year for UA, to report any gross hematuria in the interim   3. Penile fracture  - S/P repair of right corpus cavernosum on 10/27/2017 - doing well    No follow-ups on file.  These notes generated with voice recognition software. I apologize for typographical errors.  Zara Council, PA-C  [facility] [address] [place]  [place], [place] [address] [phone]

## 2020-03-13 ENCOUNTER — Other Ambulatory Visit: Payer: Self-pay

## 2020-03-13 ENCOUNTER — Ambulatory Visit: Payer: BC Managed Care – PPO | Admitting: Urology

## 2020-03-13 DIAGNOSIS — R319 Hematuria, unspecified: Secondary | ICD-10-CM

## 2020-03-13 DIAGNOSIS — S39840D Fracture of corpus cavernosum penis, subsequent encounter: Secondary | ICD-10-CM

## 2020-03-13 DIAGNOSIS — N138 Other obstructive and reflux uropathy: Secondary | ICD-10-CM

## 2020-03-31 ENCOUNTER — Other Ambulatory Visit
Admission: RE | Admit: 2020-03-31 | Discharge: 2020-03-31 | Disposition: A | Discharge status: Home / Self Care | Payer: BC Managed Care – PPO | Source: Ambulatory Visit | Attending: Physician Assistant | Admitting: Physician Assistant

## 2020-03-31 DIAGNOSIS — R079 Chest pain, unspecified: Secondary | ICD-10-CM | POA: Insufficient documentation

## 2020-03-31 LAB — TROPONIN I (HIGH SENSITIVITY): Troponin I (High Sensitivity): 6 ng/L (ref <18)

## 2020-05-07 ENCOUNTER — Telehealth: Payer: Self-pay | Admitting: Urology

## 2020-05-07 NOTE — Telephone Encounter (Signed)
When I saw him in January, he did not get scheduled for yearly follow-up.  Would you please call him and schedule him for January 2023 for UA, PSA, IPSS, SH IM and PVR?

## 2020-05-11 NOTE — Telephone Encounter (Signed)
Spoke with patient and advised results appt scheduled 

## 2020-05-28 ENCOUNTER — Other Ambulatory Visit: Payer: Self-pay | Admitting: Internal Medicine

## 2020-05-28 DIAGNOSIS — R109 Unspecified abdominal pain: Secondary | ICD-10-CM

## 2020-05-28 DIAGNOSIS — K6289 Other specified diseases of anus and rectum: Secondary | ICD-10-CM

## 2020-06-09 ENCOUNTER — Other Ambulatory Visit: Payer: Self-pay

## 2020-06-09 ENCOUNTER — Ambulatory Visit
Admission: RE | Admit: 2020-06-09 | Discharge: 2020-06-09 | Disposition: A | Discharge status: Home / Self Care | Payer: BC Managed Care – PPO | Source: Ambulatory Visit | Attending: Internal Medicine | Admitting: Internal Medicine

## 2020-06-09 DIAGNOSIS — R109 Unspecified abdominal pain: Secondary | ICD-10-CM

## 2020-06-09 DIAGNOSIS — K6289 Other specified diseases of anus and rectum: Secondary | ICD-10-CM | POA: Insufficient documentation

## 2020-06-09 LAB — POCT I-STAT CREATININE: Creatinine, Ser: 0.9 mg/dL (ref 0.61–1.24)

## 2020-06-09 MED ORDER — IOHEXOL 300 MG/ML  SOLN
100.0000 mL | Freq: Once | INTRAMUSCULAR | Status: AC | PRN
  Administered 2020-06-09: 100 mL via INTRAVENOUS

## 2021-02-10 NOTE — Progress Notes (Deleted)
03/09/2018  7:11 PM   Thomas Gaines [birth year] [id number]  Referring provider: Tracie Harrier, [facility] [place],  [place] [address]  No chief complaint on file.  Urological history: 1.  High risk hematuria -Non-smoker -CTU 2019 - NED -cysto 2019 -friable prostate -No reports of gross hematuria -UA negative for micro heme 09/2020  2. BPH with LU TS -PSA 0.53 09/2020 -I PSS *** -Managed with tamsulosin 0.4 mg daily ***  3. Penile fracture -S/P repair of right corpus cavernosum on 10/27/2017    HPI: Thomas Gaines is a 56 y.o. male who presents today for an overdue yearly appointment.      Score:  1-7 Mild 8-19 Moderate 20-35 Severe     Score: 1-7 Severe ED 8-11 Moderate ED 12-16 Mild-Moderate ED 17-21 Mild ED 22-25 No ED    PMH: Past Medical History:  Diagnosis Date   Hyperlipidemia    Hyperlipidemia    Hypertension     Surgical History: Past Surgical History:  Procedure Laterality Date   CATARACT EXTRACTION     CIRCUMCISION N/A 10/27/2017   Procedure: repair of penile fracture;  Surgeon: Abbie Sons, MD;  Location: [facility] ORS;  Service: Urology;  Laterality: N/A;    Home Medications:  Allergies as of 02/11/2021   No Known Allergies      Medication List        Accurate as of February 10, 2021  7:11 PM. If you have any questions, ask your nurse or doctor.          gabapentin 300 MG capsule Commonly known as: NEURONTIN Take 300 mg by mouth every evening.   lisinopril 20 MG tablet Commonly known as: ZESTRIL Take 10 mg by mouth daily.   metoprolol succinate 100 MG 24 hr tablet Commonly known as: TOPROL-XL Take 100 mg by mouth daily.   pramipexole 0.125 MG tablet Commonly known as: MIRAPEX Take 1 tab at night for a week then increase to 2 tabs at night and continue that dose   pregabalin 75 MG capsule Commonly known as: LYRICA Take 75 mg by mouth 2 (two) times daily.    simvastatin 20 MG tablet Commonly known as: ZOCOR Take 20 mg by mouth every evening.   tamsulosin 0.4 MG Caps capsule Commonly known as: FLOMAX Take 1 capsule (0.4 mg total) by mouth daily.        Allergies: No Known Allergies  Family History: Family History  Problem Relation Age of Onset   Prostate cancer Neg Hx    Bladder Cancer Neg Hx    Kidney cancer Neg Hx     Social History:  reports that he has never smoked. He has never used smokeless tobacco. He reports that he does not drink alcohol and does not use drugs.  ROS For pertinent review of systems please refer to history of present illness   Physical Exam: There were no vitals taken for this visit.  Constitutional:  Well nourished. Alert and oriented, No acute distress. HEENT: [place] AT, moist mucus membranes.  Trachea midline Cardiovascular: No clubbing, cyanosis, or edema. Respiratory: Normal respiratory effort, no increased work of breathing. GI: Abdomen is soft, non tender, non distended, no abdominal masses. Liver and spleen not palpable.  No hernias appreciated.  Stool sample for occult testing is not indicated.   GU: No CVA tenderness.  No bladder fullness or masses.  Patient with circumcised/uncircumcised phallus. ***Foreskin easily retracted***  Urethral meatus is patent.  No  penile discharge. No penile lesions or rashes. Scrotum without lesions, cysts, rashes and/or edema.  Testicles are located scrotally bilaterally. No masses are appreciated in the testicles. Left and right epididymis are normal. Rectal: Patient with  normal sphincter tone. Anus and perineum without scarring or rashes. No rectal masses are appreciated. Prostate is approximately *** grams, *** nodules are appreciated. Seminal vesicles are normal. Skin: No rashes, bruises or suspicious lesions. Lymph: No inguinal adenopathy. Neurologic: Grossly intact, no focal deficits, moving all 4 extremities. Psychiatric: Normal mood and affect.   Laboratory  Data: WBC (White Blood Cell Count) 4.1 - 10.2 103/uL 8.7   RBC (Red Blood Cell Count) 4.69 - 6.13 106/uL 4.80   Hemoglobin 14.1 - 18.1 gm/dL 15.3   Hematocrit 40.0 - 52.0 % 44.7   MCV (Mean Corpuscular Volume) 80.0 - 100.0 fl 93.1   MCH (Mean Corpuscular Hemoglobin) 27.0 - 31.2 pg 31.9 High    MCHC (Mean Corpuscular Hemoglobin Concentration) 32.0 - 36.0 gm/dL 34.2   Platelet Count 150 - 450 103/uL 260   RDW-CV (Red Cell Distribution Width) 11.6 - 14.8 % 12.5   MPV (Mean Platelet Volume) 9.4 - 12.4 fl 10.4   Neutrophils 1.50 - 7.80 103/uL 4.35   Lymphocytes 1.00 - 3.60 103/uL 3.27   Monocytes 0.00 - 1.50 103/uL 0.79   Eosinophils 0.00 - 0.55 103/uL 0.21   Basophils 0.00 - 0.09 103/uL 0.06   Neutrophil % 32.0 - 70.0 % 50.0   Lymphocyte % 10.0 - 50.0 % 37.6   Monocyte % 4.0 - 13.0 % 9.1   Eosinophil % 1.0 - 5.0 % 2.4   Basophil% 0.0 - 2.0 % 0.7   Immature Granulocyte % <=0.7 % 0.2   Immature Granulocyte Count <=0.06 10^3/L 0.02   Resulting Agency  [facility] - LAB  Specimen Collected: 10/06/20 07:14 Last Resulted: 10/06/20 08:41  Received From: [facility]  Result Received: 02/10/21 19:11   Glucose 70 - 110 mg/dL 89   Sodium 136 - 145 mmol/L 138   Potassium 3.6 - 5.1 mmol/L 4.7   Chloride 97 - 109 mmol/L 106   Carbon Dioxide (CO2) 22.0 - 32.0 mmol/L 27.6   Urea Nitrogen (BUN) 7 - 25 mg/dL 17   Creatinine 0.7 - 1.3 mg/dL 0.9   Glomerular Filtration Rate (eGFR), MDRD Estimate >60 mL/min/1.73sq m 88   Calcium 8.7 - 10.3 mg/dL 9.3   AST  8 - 39 U/L 19   ALT  6 - 57 U/L 23   Alk Phos (alkaline Phosphatase) 34 - 104 U/L 98   Albumin 3.5 - 4.8 g/dL 4.3   Bilirubin, Total 0.3 - 1.2 mg/dL 0.5   Protein, Total 6.1 - 7.9 g/dL 7.5   A/G Ratio 1.0 - 5.0 gm/dL 1.3   Resulting Agency  [facility] - LAB  Specimen Collected: 10/06/20 07:14 Last Resulted: 10/06/20 09:29  Received From: [facility]  Result Received: 02/10/21 19:11    Hemoglobin A1C 4.2 - 5.6 % 6.1 High    Average Blood Glucose (Calc) mg/dL 128   Resulting [place] - LAB  Narrative Performed by [facility] - LAB Normal Range:    4.2 - 5.6%  Increased Risk:  5.7 - 6.4%  Diabetes:        >= 6.5%  Glycemic Control for adults with diabetes:  <7%   Specimen Collected: 10/06/20 07:14 Last Resulted: 10/06/20 11:50  Received From: [facility]  Result Received: 02/10/21 19:11   Cholesterol, Total 100 - 200 mg/dL 167   Triglyceride 35 - 199 mg/dL 154   HDL (High Density Lipoprotein) Cholesterol 29.0 - 71.0 mg/dL 36.4   LDL Calculated 0 - 130 mg/dL 100   VLDL Cholesterol mg/dL 31   Cholesterol/HDL Ratio  4.6   Resulting [place] - LAB  Specimen Collected: 10/06/20 07:14 Last Resulted: 10/06/20 09:29  Received From: [place]  Result Received: 02/10/21 19:11   PSA (Prostate Specific Antigen), Total 0.10 - 4.00 ng/mL 0.53   Resulting Agency  [place] - LAB  Narrative Performed by [facility] - LAB Test results were determined with Beckman Coulter Hybritech Assay. Values obtained with different assay methods cannot be used interchangeably in serial testing. Assay results should not be interpreted as absolute evidence of the presence or absence of malignant disease Specimen Collected: 10/06/20 07:14 Last Resulted: 10/06/20 09:13  Received From: [facility]  Result Received: 02/10/21 19:11   Thyroid Stimulating Hormone (TSH) 0.450-5.330 uIU/ml uIU/mL 2.449   Resulting Agency  [facility] - LAB  Specimen Collected: 10/06/20 07:14 Last Resulted: 10/06/20 09:13  Received From: [place]  Result Received: 02/10/21 19:11   Color Yellow, Violet, Light Violet, Dark Violet Yellow   Clarity Clear, Other Clear   Specific Gravity 1.000 - 1.030 1.025   pH, Urine 5.0 - 8.0 5.5   Protein, Urinalysis Negative, Trace  mg/dL Negative   Glucose, Urinalysis Negative mg/dL Negative   Ketones, Urinalysis Negative mg/dL Negative   Blood, Urinalysis Negative Trace Abnormal    Nitrite, Urinalysis Negative Negative   Leukocyte Esterase, Urinalysis Negative Negative   White Blood Cells, Urinalysis None Seen, 0-3 /hpf 0-3   Red Blood Cells, Urinalysis None Seen, 0-3 /hpf 0-3   Bacteria, Urinalysis None Seen /hpf Rare Abnormal    Squamous Epithelial Cells, Urinalysis Rare, Few, None Seen /hpf Rare   Resulting Agency  [facility] - LAB  Specimen Collected: 10/06/20 07:14 Last Resulted: 10/06/20 09:39  Received From: [place]  Result Received: 02/10/21 19:11  I have reviewed the labs.  Pertinent Imaging: N/A  Assessment & Plan:    1. High risk hematuria - Hematuria work up completed in 10/2017 - findings positive for mild lateral lobe enlargement prostate with prominent hypervascularity and friability - No report of gross hematuria  - no hematuria seen on today's UA   2. Penile fracture  - S/P repair of right corpus cavernosum on 10/27/2017 - doing well   3. BPH with LUTS IPSS score is 7/1 Continue conservative management, avoiding bladder irritants and timed voiding's Discontinue the tamsulosin 0.4 mg daily and if urinary symptoms worsen, we can send in a script for tadalafil 5 mg daily RTC in 12 months for IPSS, PSA and exam   No follow-ups on file.  These notes generated with voice recognition software. I apologize for typographical errors.  Zara Council, PA-C  [facility] [address] [place]  [place], [place] [address] [phone]

## 2021-02-11 ENCOUNTER — Ambulatory Visit: Payer: Self-pay | Admitting: Urology

## 2021-02-11 DIAGNOSIS — N138 Other obstructive and reflux uropathy: Secondary | ICD-10-CM

## 2021-02-11 DIAGNOSIS — R319 Hematuria, unspecified: Secondary | ICD-10-CM

## 2021-02-11 DIAGNOSIS — S39840D Fracture of corpus cavernosum penis, subsequent encounter: Secondary | ICD-10-CM

## 2021-02-12 ENCOUNTER — Encounter: Payer: Self-pay | Admitting: Urology

## 2021-05-03 ENCOUNTER — Other Ambulatory Visit: Payer: Self-pay

## 2021-05-03 ENCOUNTER — Encounter: Payer: Self-pay | Admitting: Emergency Medicine

## 2021-05-03 ENCOUNTER — Emergency Department
Admission: EM | Admit: 2021-05-03 | Discharge: 2021-05-03 | Disposition: A | Discharge status: Home / Self Care | Payer: BC Managed Care – PPO | Attending: Emergency Medicine | Admitting: Emergency Medicine

## 2021-05-03 DIAGNOSIS — G629 Polyneuropathy, unspecified: Secondary | ICD-10-CM | POA: Insufficient documentation

## 2021-05-03 DIAGNOSIS — R200 Anesthesia of skin: Secondary | ICD-10-CM | POA: Diagnosis present

## 2021-05-03 NOTE — ED Notes (Signed)
See triage note  presents with numbness to left lower leg  states area is to lateral aspect of leg   ?

## 2021-05-03 NOTE — ED Provider Notes (Signed)
? ?[facility] ?Provider Note ? ? ? Event Date/Time  ? First MD Initiated Contact with Patient 05/03/21 0732   ?  (approximate) ? ? ?History  ? ?Leg Pain (/) ? ?Spanish interpreter used ?HPI ? ?Thomas Gaines Thomas Gaines is a 56 y.o. male with a history of high blood pressure, restless legs who presents with complaints of an area of numbness on the lateral aspect of his right lower leg.  Patient reports it has been present for about 24 to 48 hours.  He denies any weakness.  No difficulty speaking, no dizziness.  No other symptoms.  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  ? ? ?Physical Exam  ? ?Triage Vital Signs: ?ED Triage Vitals  ?Enc Vitals Group  ?   BP 05/03/21 0734 (!) 181/82  ?   Pulse Rate 05/03/21 0734 77  ?   Resp 05/03/21 0734 16  ?   Temp 05/03/21 0734 99.2 ?F (37.3 ?C)  ?   Temp Source 05/03/21 0734 Oral  ?   SpO2 05/03/21 0734 99 %  ?   Weight 05/03/21 0735 93.9 kg (207 lb)  ?   Height 05/03/21 0735 1.676 m (5\' 6" )  ?   Head Circumference --   ?   Peak Flow --   ?   Pain Score 05/03/21 0734 0  ?   Pain Loc --   ?   Pain Edu? --   ?   Excl. in GC? --   ? ? ?Most recent vital signs: ?Vitals:  ? 05/03/21 0734  ?BP: (!) 181/82  ?Pulse: 77  ?Resp: 16  ?Temp: 99.2 ?F (37.3 ?C)  ?SpO2: 99%  ? ? ? ?General: Awake, no distress.  ?CV:  Good peripheral perfusion.  ?Resp:  Normal effort.  ?Abd:  No distention.  ?Other:  Patient describes proximately 3 to 4 cm oval area of tingling/numbness on the right lateral calf/lower leg.  Extremity is warm and well-perfused.  Strength is normal.  Cranial nerves are all normal.  Ambulates very well without difficulty.  No evidence of injury. ? ? ?ED Results / Procedures / Treatments  ? ?Labs ?(all labs ordered are listed, but only abnormal results are displayed) ?Labs Reviewed - No data to  display ? ? ?EKG ? ? ? ? ?RADIOLOGY ? ? ? ? ?PROCEDURES: ? ?Critical Care performed:  ? ?Procedures ? ? ?MEDICATIONS ORDERED IN ED: ?Medications - No data to display ? ? ?IMPRESSION / MDM / ASSESSMENT AND PLAN / ED COURSE  ?I reviewed the triage vital signs and the nursing notes. ? ?Patient with area of isolated neuropathy.  No other concerning neuro symptoms.  Has been present for over 24 hours.  No headache or trauma.  No weakness. ? ?No indication for emergent imaging or work-up, not consistent with CVA.  Will have patient follow-up with PCP for further eval, return precautions if any worsening or change discussed with patient via interpreter ? ? ? ? ? ?  ? ? ?FINAL CLINICAL IMPRESSION(S) / ED DIAGNOSES  ? ?Final diagnoses:  ?Neuropathy  ? ? ? ?Rx / DC Orders  ? ?ED Discharge Orders   ? ? None  ? ?  ? ? ? ?Note:  This document was prepared using Dragon voice recognition software and may include unintentional dictation errors. ?  ?05/05/21,  MD ?05/03/21 5329 ? ?

## 2021-05-03 NOTE — ED Triage Notes (Signed)
Presents with left leg numbness   states numbness is to lateral aspect of leg and started yesterday  ambulates well ?

## 2021-11-18 ENCOUNTER — Emergency Department
Admission: EM | Admit: 2021-11-18 | Discharge: 2021-11-18 | Disposition: A | Discharge status: Home / Self Care | Payer: BC Managed Care – PPO | Attending: Emergency Medicine | Admitting: Emergency Medicine

## 2021-11-18 ENCOUNTER — Encounter: Payer: Self-pay | Admitting: Emergency Medicine

## 2021-11-18 ENCOUNTER — Other Ambulatory Visit: Payer: Self-pay

## 2021-11-18 ENCOUNTER — Telehealth: Payer: Self-pay | Admitting: Emergency Medicine

## 2021-11-18 ENCOUNTER — Emergency Department: Payer: BC Managed Care – PPO

## 2021-11-18 DIAGNOSIS — J029 Acute pharyngitis, unspecified: Secondary | ICD-10-CM | POA: Diagnosis not present

## 2021-11-18 DIAGNOSIS — J069 Acute upper respiratory infection, unspecified: Secondary | ICD-10-CM | POA: Diagnosis not present

## 2021-11-18 DIAGNOSIS — I10 Essential (primary) hypertension: Secondary | ICD-10-CM | POA: Diagnosis not present

## 2021-11-18 DIAGNOSIS — Z1152 Encounter for screening for COVID-19: Secondary | ICD-10-CM | POA: Insufficient documentation

## 2021-11-18 DIAGNOSIS — R1310 Dysphagia, unspecified: Secondary | ICD-10-CM | POA: Diagnosis present

## 2021-11-18 LAB — GROUP A STREP BY PCR: Group A Strep by PCR: NOT DETECTED

## 2021-11-18 LAB — BASIC METABOLIC PANEL
Anion gap: 6 (ref 5–15)
BUN: 19 mg/dL (ref 6–20)
CO2: 25 mmol/L (ref 22–32)
Calcium: 8.7 mg/dL — ABNORMAL LOW (ref 8.9–10.3)
Chloride: 110 mmol/L (ref 98–111)
Creatinine, Ser: 0.84 mg/dL (ref 0.61–1.24)
GFR, Estimated: >60 mL/min (ref >60)
Glucose, Bld: 124 mg/dL — ABNORMAL HIGH (ref 70–99)
Potassium: 3.8 mmol/L (ref 3.5–5.1)
Sodium: 141 mmol/L (ref 135–145)

## 2021-11-18 LAB — CBC WITH DIFFERENTIAL/PLATELET
Abs Immature Granulocytes: 0.03 K/uL (ref 0.00–0.07)
Basophils Absolute: 0.1 K/uL (ref 0.0–0.1)
Basophils Relative: 1 %
Eosinophils Absolute: 0.2 K/uL (ref 0.0–0.5)
Eosinophils Relative: 2 %
HCT: 45 % (ref 39.0–52.0)
Hemoglobin: 15.3 g/dL (ref 13.0–17.0)
Immature Granulocytes: 0 %
Lymphocytes Relative: 38 %
Lymphs Abs: 3.5 K/uL (ref 0.7–4.0)
MCH: 31.2 pg (ref 26.0–34.0)
MCHC: 34 g/dL (ref 30.0–36.0)
MCV: 91.6 fL (ref 80.0–100.0)
Monocytes Absolute: 0.9 K/uL (ref 0.1–1.0)
Monocytes Relative: 10 %
Neutro Abs: 4.5 K/uL (ref 1.7–7.7)
Neutrophils Relative %: 49 %
Platelets: 229 K/uL (ref 150–400)
RBC: 4.91 MIL/uL (ref 4.22–5.81)
RDW: 12.6 % (ref 11.5–15.5)
WBC: 9.2 K/uL (ref 4.0–10.5)
nRBC: 0 % (ref 0.0–0.2)

## 2021-11-18 LAB — SARS CORONAVIRUS 2 BY RT PCR: SARS Coronavirus 2 by RT PCR: NEGATIVE

## 2021-11-18 MED ORDER — DEXAMETHASONE SODIUM PHOSPHATE 10 MG/ML IJ SOLN
10.0000 mg | Freq: Once | INTRAMUSCULAR | Status: AC
  Administered 2021-11-18: 10 mg via INTRAVENOUS
  Filled 2021-11-18: qty 1

## 2021-11-18 MED ORDER — SODIUM CHLORIDE 0.9 % IV BOLUS (SEPSIS): 1000.0000 mL | Freq: Once | INTRAVENOUS | Status: DC

## 2021-11-18 MED ORDER — IBUPROFEN 600 MG PO TABS: 600.0000 mg | ORAL_TABLET | Freq: Four times a day (QID) | ORAL | 0 refills | Status: AC | PRN

## 2021-11-18 MED ORDER — IOHEXOL 300 MG/ML  SOLN
75.0000 mL | Freq: Once | INTRAMUSCULAR | Status: AC | PRN
  Administered 2021-11-18: 75 mL via INTRAVENOUS

## 2021-11-18 MED ORDER — MECLIZINE HCL 25 MG PO TABS: 25.0000 mg | ORAL_TABLET | Freq: Three times a day (TID) | ORAL | 0 refills | Status: DC | PRN

## 2021-11-18 MED ORDER — SODIUM CHLORIDE 0.9 % IV SOLN: 3.0000 g | Freq: Once | INTRAVENOUS | Status: DC

## 2021-11-18 MED ORDER — PREDNISONE 10 MG (21) PO TBPK: ORAL_TABLET | ORAL | 0 refills | Status: DC

## 2021-11-18 MED ORDER — IBUPROFEN 600 MG PO TABS: 600.0000 mg | ORAL_TABLET | Freq: Four times a day (QID) | ORAL | 0 refills | Status: DC | PRN

## 2021-11-18 NOTE — ED Triage Notes (Signed)
Triage assessment via Spanish interpreter:  Pt arrived via POV with reports of waking up with sore throat, pt reports he went to work and felt like it was hard to swallow.  Pt denies any cough or recent fever.  Pt reports similar feeling several years ago, but does not know what happened.

## 2021-11-18 NOTE — ED Provider Notes (Signed)
[facility] Provider Note    Event Date/Time   First MD Initiated Contact with Patient 11/18/21 [phone]     (approximate)   History   Dysphagia   HPI  Thomas Gaines is a 56 y.o. male  with HTN and HLD who is otherwise healthy who comes in with sore throat. Pt reports waking up with sore throat. 430AM went to go to work and noted inside of the throat was feeling swollen and came to ER. Denies any surgeries on neck. Denies any fevers.  Reports being able to swallow right now. + being able to tolerate PO. Denies cough or congestion in nose.  Pt is married denies any new sexual partners    Pt also reports some vertigo symptoms. He reports having this before and denies any dizziness now just requestion something to help with it. Reports dizziness with head movement.       Physical Exam   Triage Vital Signs: ED Triage Vitals  Enc Vitals Group     BP 11/18/21 0619 (!) 133/95     Pulse Rate 11/18/21 0619 92     Resp 11/18/21 0619 18     Temp 11/18/21 0619 98.1 F (36.7 C)     Temp Source 11/18/21 0619 Oral     SpO2 11/18/21 0619 100 %     Weight 11/18/21 0546 204 lb (92.5 kg)     Height 11/18/21 0546 5\' 6"  (1.676 m)     Head Circumference --      Peak Flow --      Pain Score 11/18/21 0547 0     Pain Loc --      Pain Edu? --      Excl. in GC? --     Most recent vital signs: Vitals:   11/18/21 0619 11/18/21 0837  BP: (!) 133/95 (!) 162/86  Pulse: 92 66  Resp: 18 17  Temp: 98.1 F (36.7 C) 98.1 F (36.7 C)  SpO2: 100% 99%     General: Awake, no distress.  CV:  Good peripheral perfusion.  Resp:  Normal effort.  Abd:  No distention. Soft and non tender  Other:  Full rom of neck. UVALA midline- mild fullness in the tonsils bilaterally. No exudated noted on the tonsils    ED Results / Procedures / Treatments   Labs (all labs ordered are listed, but only abnormal results are displayed) Labs Reviewed  BASIC METABOLIC PANEL -  Abnormal; Notable for the following components:      Result Value   Glucose, Bld 124 (*)    Calcium 8.7 (*)    All other components within normal limits  GROUP A STREP BY PCR  SARS CORONAVIRUS 2 BY RT PCR  CBC WITH DIFFERENTIAL/PLATELET        RADIOLOGY I have reviewed the CT personally and no evidence of abscess    PROCEDURES:  Critical Care performed: No  Procedures   MEDICATIONS ORDERED IN ED: Medications  sodium chloride 0.9 % bolus 1,000 mL (has no administration in time range)  dexamethasone (DECADRON) injection 10 mg (has no administration in time range)  Ampicillin-Sulbactam (UNASYN) 3 g in sodium chloride 0.9 % 100 mL IVPB (has no administration in time range)  iohexol (OMNIPAQUE) 300 MG/ML solution 75 mL (75 mLs Intravenous Contrast Given 11/18/21 0709)     IMPRESSION / MDM / ASSESSMENT AND PLAN / ED COURSE  I reviewed the triage vital signs and the nursing notes.   Patient's presentation  is most consistent with acute presentation with potential threat to life or bodily function.   Differential: strep, covid, abscess vs viral illness -- denies concern for Gonor/Chyl,   Cbc normal  Bmp glucose 124 Strep negative Covid negative  IMPRESSION: 1. Symmetric lingual and palatine tonsil hypertrophy such as can be due to URI, but no hyperenhancement to strongly suggest tonsillitis. Normal larynx, epiglottis. 2. Otherwise negative CT appearance of the Neck.   Recommend repeat BP recheck outpatient.   Pt seen in [place] ext due to full hospital.    FINAL CLINICAL IMPRESSION(S) / ED DIAGNOSES   Final diagnoses:  Upper respiratory tract infection, unspecified type  Acute viral pharyngitis     Rx / DC Orders   ED Discharge Orders     None        Note:  This document was prepared using Dragon voice recognition software and may include unintentional dictation errors.   Vanessa , MD 11/18/21 [phone]

## 2021-11-18 NOTE — Telephone Encounter (Signed)
Meds resent to new pharmacy per request.

## 2021-11-18 NOTE — ED Notes (Signed)
Patient discharged to home per MD order. Patient in stable condition, and deemed medically cleared by ED provider for discharge. Discharge instructions reviewed with patient/family using "Teach Back"; verbalized understanding of medication education and administration, and information about follow-up care. Denies further concerns. ° °

## 2021-11-18 NOTE — Discharge Instructions (Addendum)
Take the steroids and ibuprofen with food to help with tonsil swelling.   Follow up reheck of BP outpatient.

## 2022-07-05 ENCOUNTER — Emergency Department
Admission: EM | Admit: 2022-07-05 | Discharge: 2022-07-05 | Disposition: A | Discharge status: Home / Self Care | Payer: BC Managed Care – PPO | Attending: Emergency Medicine | Admitting: Emergency Medicine

## 2022-07-05 ENCOUNTER — Emergency Department: Payer: BC Managed Care – PPO

## 2022-07-05 ENCOUNTER — Other Ambulatory Visit: Payer: Self-pay

## 2022-07-05 ENCOUNTER — Encounter: Payer: Self-pay | Admitting: Emergency Medicine

## 2022-07-05 DIAGNOSIS — I10 Essential (primary) hypertension: Secondary | ICD-10-CM | POA: Insufficient documentation

## 2022-07-05 DIAGNOSIS — N50812 Left testicular pain: Secondary | ICD-10-CM | POA: Insufficient documentation

## 2022-07-05 DIAGNOSIS — N5082 Scrotal pain: Secondary | ICD-10-CM | POA: Insufficient documentation

## 2022-07-05 DIAGNOSIS — R300 Dysuria: Secondary | ICD-10-CM | POA: Diagnosis present

## 2022-07-05 LAB — URINALYSIS, ROUTINE W REFLEX MICROSCOPIC
Bacteria, UA: NONE SEEN
Bilirubin Urine: NEGATIVE
Glucose, UA: NEGATIVE mg/dL
Ketones, ur: NEGATIVE mg/dL
Leukocytes,Ua: NEGATIVE
Nitrite: NEGATIVE
Protein, ur: NEGATIVE mg/dL
Specific Gravity, Urine: 1.015 (ref 1.005–1.030)
Squamous Epithelial / HPF: NONE SEEN /HPF (ref 0–5)
pH: 5 (ref 5.0–8.0)

## 2022-07-05 MED ORDER — TAMSULOSIN HCL 0.4 MG PO CAPS: 0.4000 mg | ORAL_CAPSULE | Freq: Every day | ORAL | 0 refills | Status: AC

## 2022-07-05 NOTE — ED Provider Notes (Signed)
[facility] Provider Note    Event Date/Time   First MD Initiated Contact with Patient 07/05/22 [phone]     (approximate)   History   Testicle Pain   HPI  Thomas Gaines is a 56 y.o. male with history of hypertension, hyperlipidemia and as listed in EMR presents to the emergency department for treatment and evaluation of testicular pain x 4 weeks that radiates into his back. He has also had some dysuria and difficulty starting stream. No fever.    Physical Exam   Triage Vital Signs: ED Triage Vitals  Enc Vitals Group     BP 07/05/22 0846 (!) 195/97     Pulse Rate 07/05/22 0846 91     Resp 07/05/22 0846 18     Temp 07/05/22 0846 98.3 F (36.8 C)     Temp src --      SpO2 07/05/22 0846 99 %     Weight 07/05/22 0850 210 lb (95.3 kg)     Height 07/05/22 0850 5\' 6"  (1.676 m)     Head Circumference --      Peak Flow --      Pain Score 07/05/22 0850 8     Pain Loc --      Pain Edu? --      Excl. in GC? --     Most recent vital signs: Vitals:   07/05/22 0846 07/05/22 1131  BP: (!) 195/97 (!) 188/90  Pulse: 91 80  Resp: 18 18  Temp: 98.3 F (36.8 C)   SpO2: 99% 99%    General: Awake, no distress.  CV:  Good peripheral perfusion.  Resp:  Normal effort.  Abd:  No distention.  Other:  No testicular edema or erythema.   ED Results / Procedures / Treatments   Labs (all labs ordered are listed, but only abnormal results are displayed) Labs Reviewed  URINALYSIS, ROUTINE W REFLEX MICROSCOPIC - Abnormal; Notable for the following components:      Result Value   Color, Urine YELLOW (*)    APPearance CLEAR (*)    Hgb urine dipstick SMALL (*)    All other components within normal limits     EKG  Not indicated.   RADIOLOGY  Image and radiology report reviewed and interpreted by me. Radiology report consistent with the same.  Testicular ultrasound shows no torsion or mass.  There is no sign of inflammatory disease.  No hydrocele  or varicocele  PROCEDURES:  Critical Care performed: No  Procedures   MEDICATIONS ORDERED IN ED:  Medications - No data to display   IMPRESSION / MDM / ASSESSMENT AND PLAN / ED COURSE   I have reviewed the triage note.  Differential diagnosis includes, but is not limited to, testicular torsion, kidney stone, acute cystitis, pyelonephritis  Patient's presentation is most consistent with acute complicated illness / injury requiring diagnostic workup.  57 year old male presenting to the emergency department for ongoing testicular pain.  See HPI for further details.  Plan will be to get an ultrasound to rule out torsion or varicocele/hydrocele and a urinalysis to evaluate for acute cystitis or hematuria.  Ultrasound is normal.  Results discussed with the patient.  Plan will be to get a CT renal stone study.  Patient is agreeable to the plan.  CT shows no acute findings.  Plan will be to treat him with Flomax as there is no indication of infection in his urinalysis and he will be given information for follow-up with  urology.  If symptoms change or worsen he was encouraged to return to the emergency department.  Marchelle Folks, in-house interpreter utilized for communication with the patient.  He denied further questions upon discharge.      FINAL CLINICAL IMPRESSION(S) / ED DIAGNOSES   Final diagnoses:  Scrotal pain  Dysuria     Rx / DC Orders   ED Discharge Orders          Ordered    tamsulosin (FLOMAX) 0.4 MG CAPS capsule  Daily        07/05/22 1103             Note:  This document was prepared using Dragon voice recognition software and may include unintentional dictation errors.   Chinita Pester, FNP 07/06/22 1426    Pilar Jarvis, MD 07/06/22 [phone]

## 2022-07-05 NOTE — ED Notes (Signed)
Pt reports that he has been hurting for 3-4 weeks, states that he was seen a week and a half ago at [facility] clinic and given a medication and told that if that didn't help to be seen in the er and that they thought the pain could be due to kidney stones, pt states the left testicle is hurting and he hasn't noticed any swelling or redness and states that the pain has now radiated into his lower abd bilat and bilat lower back

## 2022-07-05 NOTE — ED Notes (Signed)
Pt taken to ct 

## 2022-07-05 NOTE — ED Triage Notes (Signed)
Pt to ED for left testicular pain for 4 weeks. Reports radiating to back now. States went to [facility] and was given meds to see if it works, cannot state name of medication.  Reports it is painful to urinate and difficult to urinate.

## 2022-07-13 ENCOUNTER — Ambulatory Visit: Payer: BC Managed Care – PPO | Admitting: Urology

## 2022-07-13 ENCOUNTER — Encounter: Payer: Self-pay | Admitting: Urology

## 2022-07-13 VITALS — BP 148/87 | HR 87 | Ht 66.0 in | Wt 211.0 lb

## 2022-07-13 DIAGNOSIS — G8929 Other chronic pain: Secondary | ICD-10-CM | POA: Diagnosis not present

## 2022-07-13 DIAGNOSIS — N50819 Testicular pain, unspecified: Secondary | ICD-10-CM

## 2022-07-13 DIAGNOSIS — N5082 Scrotal pain: Secondary | ICD-10-CM | POA: Diagnosis not present

## 2022-07-13 DIAGNOSIS — M545 Low back pain, unspecified: Secondary | ICD-10-CM

## 2022-07-13 MED ORDER — AMITRIPTYLINE HCL 25 MG PO TABS: 25.0000 mg | ORAL_TABLET | Freq: Every day | ORAL | 1 refills | Status: AC

## 2022-07-13 NOTE — Progress Notes (Signed)
I, Thomas Gaines, acting as a Neurosurgeon for Thomas Altes, MD., have documented all relevant documentation on the behalf of Thomas Altes, MD, as directed by  Thomas Altes, MD while in the presence of Thomas Altes, MD.   07/13/2022 3:54 PM   Thomas Gaines Thomas Gaines 1965-05-08 [id number]  Referring provider: Barbette Reichmann, MD [address] [facility] [place],  [place] [address]  Chief Complaint  Patient presents with   Testicle Pain    HPI: Thomas Gaines is a 57 y.o. male presenting for evaluation of scrotal pain. A Thomas Gaines interpreter was present during this visit.  Previously followed for BPH with low urinary tract symptoms, history penile fracture. Last saw Thomas Gaines 02/14/2019. Refer to that note for a clinical summary. ED visit 07/05/22 sith a 4 week history of left scrotal pain radiating to back region and urinary hesitancy and dysuria. Urinalysis was unremarkable. A scrotal ultrasound was performed, which showed no abnormalities. He had small right epidermal cysts. A CT renal stone study was also performed, which showed no nephrolithiasis or hydronephrosis. He was discharged on Tamsulosin.  No identifiable precipitating or alleviating factors to his pain. He has to wear a harness at work, which worsens the pain. No improvement in his symptoms on Tamsulosin. Pain is intermittent and at its most severe is rated 8/10. He also has radiation of the pain to the low back region bilaterally.   PMH: Past Medical History:  Diagnosis Date   Hyperlipidemia    Hyperlipidemia    Hypertension     Surgical History: Past Surgical History:  Procedure Laterality Date   CATARACT EXTRACTION     CIRCUMCISION N/A 10/27/2017   Procedure: repair of penile fracture;  Surgeon: Thomas Altes, MD;  Location: [facility] ORS;  Service: Urology;  Laterality: N/A;    Home Medications:  Allergies as of 07/13/2022   No Known Allergies      Medication List         Accurate as of July 13, 2022  3:54 PM. If you have any questions, ask your nurse or doctor.          STOP taking these medications    meclizine 25 MG tablet Commonly known as: ANTIVERT Stopped by: Thomas Altes, MD   metoprolol succinate 100 MG 24 hr tablet Commonly known as: TOPROL-XL Stopped by: Thomas Altes, MD   pramipexole 0.125 MG tablet Commonly known as: MIRAPEX Stopped by: Thomas Altes, MD   predniSONE 10 MG (21) Tbpk tablet Commonly known as: STERAPRED UNI-PAK 21 TAB Stopped by: Thomas Altes, MD   pregabalin 75 MG capsule Commonly known as: LYRICA Stopped by: Thomas Altes, MD       TAKE these medications    amitriptyline 25 MG tablet Commonly known as: ELAVIL Take 1 tablet (25 mg total) by mouth at bedtime. Started by: Thomas Altes, MD   cefdinir 300 MG capsule Commonly known as: OMNICEF Take 300 mg by mouth 2 (two) times daily.   gabapentin 300 MG capsule Commonly known as: NEURONTIN Take 300 mg by mouth every evening.   lisinopril 20 MG tablet Commonly known as: ZESTRIL Take 10 mg by mouth daily.   naproxen 500 MG EC tablet Commonly known as: EC NAPROSYN Take 500 mg by mouth 2 (two) times daily with a meal.   simvastatin 20 MG tablet Commonly known as: ZOCOR Take 20 mg by mouth every evening.   tamsulosin 0.4 MG  Caps capsule Commonly known as: FLOMAX Take 1 capsule (0.4 mg total) by mouth daily.         Family History: Family History  Problem Relation Age of Onset   Prostate cancer Neg Hx    Bladder Cancer Neg Hx    Kidney cancer Neg Hx     Social History:  reports that he has never smoked. He has never used smokeless tobacco. He reports that he does not drink alcohol and does not use drugs.   Physical Exam: BP (!) 148/87   Pulse 87   Ht 5\' 6"  (1.676 m)   Wt 211 lb (95.7 kg)   BMI 34.06 kg/m   Constitutional:  Alert and oriented, No acute distress. HEENT: [place] AT, moist mucus membranes.  Trachea  midline, no masses. Cardiovascular: No clubbing, cyanosis, or edema. Respiratory: Normal respiratory effort, no increased work of breathing. GI: Abdomen is soft, nontender, nondistended, no abdominal masses GU: Phallus without lesions. Testes descended bilaterally without masses of tenderness. No evidence of hernia. Skin: No rashes, bruises or suspicious lesions. Neurologic: Grossly intact, no focal deficits, moving all 4 extremities. Psychiatric: Normal mood and affect.  Laboratory Data:  Urinalysis Microscopy negative.   Assessment & Plan:    1. Scrotal pain No GU etiology identified. Scrotal ultrasound is unremarkable and CT abdomen/pelvis shows no evidence of stone or retroperitoneal pathology that would indicate referred pain. Exam today negative. We discussed the possibility of lumbar disc disease and neuropathis pain. Recommen PCP follow up for further evaluation of lumbar disc disease. Trial Amitriptyline 20 mg POQHS. PA follow up in one month for symptom recheck.    [facility] [address] [place], [place] [address] [phone]

## 2022-07-14 LAB — URINALYSIS, COMPLETE
Bilirubin, UA: NEGATIVE
Glucose, UA: NEGATIVE
Ketones, UA: NEGATIVE
Leukocytes,UA: NEGATIVE
Nitrite, UA: NEGATIVE
Protein,UA: NEGATIVE
Specific Gravity, UA: 1.02 (ref 1.005–1.030)
Urobilinogen, Ur: 0.2 mg/dL (ref 0.2–1.0)
pH, UA: 5.5 (ref 5.0–7.5)

## 2022-07-14 LAB — MICROSCOPIC EXAMINATION

## 2022-07-15 ENCOUNTER — Telehealth: Payer: Self-pay | Admitting: Urology

## 2022-07-15 NOTE — Telephone Encounter (Signed)
Okay to take 1/2 tablet

## 2022-07-15 NOTE — Telephone Encounter (Signed)
PT wants to know if we can lower the dose of meds, because he's feeling dizzy.amitriptyline   OR could he just take 1/2 pill?  He will need interpreter to understand.

## 2022-07-15 NOTE — Telephone Encounter (Signed)
Patient notified through interpreter services

## 2022-08-11 NOTE — Progress Notes (Deleted)
08/12/2022 10:20 AM   Thomas Gaines 11/18/1965 [id number]  Referring provider: Barbette Reichmann, MD [address] [facility] [place],  [place] [address]  Urological history: 1. BPH with LU TS -PSA (08/2022) 0.68 -tamsulosin 0.4 mg daily  2. High risk hematuria -non smoker -CTU (10/2017) - NED -cysto (09/20219) - friable prostate -contrast CT (06/2020) - NED -non contrast CT (06/2022)  3. Penile fracture -repair of right corpus cavernosum (09/20219)  4. Epididymal cysts -scrotal US (06/2022) - Few small epididymal cysts on the right, the largest measuring 6 x 4 x 6 mm.  No chief complaint on file.   HPI: Thomas Gaines is a 57 y.o. male who presents today for one month follow up after a trial of amitriptyline 25 mg at bedtime.  Previous records reviewed.   He was recently seen by Dr. Lonna Cobb on 07/13/2022 after he was seen in the ED for left scrotal pain radiating to the back, urinary hesitancy and dysuria.  The CT in the ED was negative, scrotal ultrasound w/ small right epididymal cysts.  UA was unremarkable.  He was started on amitriptyline 25 mg.  He was also instructed to see his PCP to be evaluated for lumber disease.  He called the office on 07/15/2022 complaining that the amitriptyline was causing dizziness and he was instructed to reduce the dose to 12.5 mg nightly.    ***  PMH: Past Medical History:  Diagnosis Date   Hyperlipidemia    Hyperlipidemia    Hypertension     Surgical History: Past Surgical History:  Procedure Laterality Date   CATARACT EXTRACTION     CIRCUMCISION N/A 10/27/2017   Procedure: repair of penile fracture;  Surgeon: Riki Altes, MD;  Location: [facility] ORS;  Service: Urology;  Laterality: N/A;    Home Medications:  Allergies as of 08/12/2022   No Known Allergies      Medication List        Accurate as of August 11, 2022 10:20 AM. If you have any questions, ask your nurse or doctor.           amitriptyline 25 MG tablet Commonly known as: ELAVIL Take 1 tablet (25 mg total) by mouth at bedtime.   cefdinir 300 MG capsule Commonly known as: OMNICEF Take 300 mg by mouth 2 (two) times daily.   gabapentin 300 MG capsule Commonly known as: NEURONTIN Take 300 mg by mouth every evening.   lisinopril 20 MG tablet Commonly known as: ZESTRIL Take 10 mg by mouth daily.   naproxen 500 MG EC tablet Commonly known as: EC NAPROSYN Take 500 mg by mouth 2 (two) times daily with a meal.   simvastatin 20 MG tablet Commonly known as: ZOCOR Take 20 mg by mouth every evening.   tamsulosin 0.4 MG Caps capsule Commonly known as: FLOMAX Take 1 capsule (0.4 mg total) by mouth daily.        Allergies: No Known Allergies  Family History: Family History  Problem Relation Age of Onset   Prostate cancer Neg Hx    Bladder Cancer Neg Hx    Kidney cancer Neg Hx     Social History:  reports that he has never smoked. He has never used smokeless tobacco. He reports that he does not drink alcohol and does not use drugs.  ROS: Pertinent ROS in HPI  Physical Exam: There were no vitals taken for this visit.  Constitutional:  Well nourished. Alert and oriented, No acute distress. HEENT:  [place] AT, moist mucus membranes.  Trachea midline, no masses. Cardiovascular: No clubbing, cyanosis, or edema. Respiratory: Normal respiratory effort, no increased work of breathing. GI: Abdomen is soft, non tender, non distended, no abdominal masses. Liver and spleen not palpable.  No hernias appreciated.  Stool sample for occult testing is not indicated.   GU: No CVA tenderness.  No bladder fullness or masses.  Patient with circumcised/uncircumcised phallus. ***Foreskin easily retracted***  Urethral meatus is patent.  No penile discharge. No penile lesions or rashes. Scrotum without lesions, cysts, rashes and/or edema.  Testicles are located scrotally bilaterally. No masses are appreciated in the  testicles. Left and right epididymis are normal. Rectal: Patient with  normal sphincter tone. Anus and perineum without scarring or rashes. No rectal masses are appreciated. Prostate is approximately *** grams, *** nodules are appreciated. Seminal vesicles are normal. Skin: No rashes, bruises or suspicious lesions. Lymph: No cervical or inguinal adenopathy. Neurologic: Grossly intact, no focal deficits, moving all 4 extremities. Psychiatric: Normal mood and affect.  Laboratory Data: Serum creatinine (07/2022) 0.9, eGFR 100 Hemoglobin A1c (07/2022) 6.3 PSA (07/2022) 0.68 TSH (07/2022) 1.427 Component     Latest Ref Rng 07/13/2022  Specific Gravity, UA     1.005 - 1.030  1.020   pH, UA     5.0 - 7.5  5.5   Color, UA     Yellow  Yellow   Appearance Ur     Clear  Clear   Leukocytes,UA     Negative  Negative   Protein,UA     Negative/Trace  Negative   Glucose, UA     Negative  Negative   Ketones, UA     Negative  Negative   RBC, UA     Negative  1+ !   Bilirubin, UA     Negative  Negative   Urobilinogen, Ur     0.2 - 1.0 mg/dL 0.2   Nitrite, UA     Negative  Negative   Microscopic Examination See below:     Legend: ! Abnormal  Component     Latest Ref Rng 07/13/2022  WBC, UA     0 - 5 /hpf 0-5   Bacteria, UA     None seen/Few  Few   Epithelial Cells (non renal)     0 - 10 /hpf 0-10   RBC, Urine     0 - 2 /hpf 0-2   I have reviewed the labs.   Pertinent Imaging: CLINICAL DATA:  Left testicular pain for 4 weeks now radiating to the back.   EXAM: CT ABDOMEN AND PELVIS WITHOUT CONTRAST   TECHNIQUE: Multidetector CT imaging of the abdomen and pelvis was performed following the standard protocol without IV contrast.   RADIATION DOSE REDUCTION: This exam was performed according to the departmental dose-optimization program which includes automated exposure control, adjustment of the mA and/or kV according to patient size and/or use of iterative reconstruction  technique.   COMPARISON:  CT abdomen pelvis 06/09/2020   FINDINGS: Lower chest: No acute abnormality.   Hepatobiliary: No focal liver abnormality is seen. No gallstones, gallbladder wall thickening, or biliary dilatation.   Pancreas: Unremarkable. No pancreatic ductal dilatation or surrounding inflammatory changes.   Spleen: Normal in size without focal abnormality.   Adrenals/Urinary Tract: Adrenal glands are unremarkable. Kidneys are normal, without renal calculi, focal lesion, or hydronephrosis. Bladder is unremarkable.   Stomach/Bowel: Stomach is within normal limits. Appendix appears normal. No evidence of bowel wall thickening, distention, or  inflammatory changes.   Vascular/Lymphatic: No significant vascular findings are present. No enlarged abdominal or pelvic lymph nodes.   Reproductive: Prostate is unremarkable.   Other: No abdominal wall hernia or abnormality. No abdominopelvic ascites.   Musculoskeletal: No acute or significant osseous findings.   IMPRESSION: 1. No nephrolithiasis or hydronephrosis. 2. No acute abnormality within the abdomen or pelvis.     Electronically Signed   By: Acquanetta Belling M.D.   On: 07/05/2022 10:41  CLINICAL DATA:  Testicular pain   EXAM: SCROTAL ULTRASOUND   DOPPLER ULTRASOUND OF THE TESTICLES   TECHNIQUE: Complete ultrasound examination of the testicles, epididymis, and other scrotal structures was performed. Color and spectral Doppler ultrasound were also utilized to evaluate blood flow to the testicles.   COMPARISON:  None Available.   FINDINGS: Right testicle   Measurements: 4.1 x 2.2 x 3.2 cm. Normal morphology. No hyperemia. Normal Doppler pattern.   Left testicle   Measurements: 4.5 x 2.2 x 2.7 cm. Normal morphology. No hyperemia. Normal Doppler pattern.   Right epididymis: Few epididymal cysts, the largest measuring 6 x 4 x 6 mm. No hyperemia.   Left epididymis:  Normal in size and appearance.    Hydrocele:  None visualized.   Varicocele:  None visualized.   Pulsed Doppler interrogation of both testes demonstrates normal low resistance arterial and venous waveforms bilaterally.   IMPRESSION: No evidence of testicular torsion or mass. No sign of inflammatory disease. No cause for pain identified. Few small epididymal cysts on the right, the largest measuring 6 x 4 x 6 mm.     Electronically Signed   By: Paulina Fusi M.D.   On: 07/05/2022 10:00  I have independently reviewed the films.  See HPI.    Assessment & Plan:    1. BPH with LUTS -PSA stable  -symptoms - *** -most bothersome symptoms are *** -continue conservative management, avoiding bladder irritants and timed voiding's -Continue tamsulosin 0.4 mg daily   2. High risk hematuria -non smoker -work up (2019) - friable prostate -no reports of gross heme -recent UA negative for micro heme  3. Penile fracture ***  4. Scrotal pain/epididymal cysts  ***     No follow-ups on file.  These notes generated with voice recognition software. I apologize for typographical errors.  Cloretta Ned  [facility] [address]  [address] [place], [place] [address] [phone]

## 2022-08-12 ENCOUNTER — Ambulatory Visit: Payer: BC Managed Care – PPO | Admitting: Urology

## 2022-08-12 ENCOUNTER — Encounter: Payer: Self-pay | Admitting: Urology

## 2022-08-12 DIAGNOSIS — N503 Cyst of epididymis: Secondary | ICD-10-CM

## 2022-08-12 DIAGNOSIS — S39840S Fracture of corpus cavernosum penis, sequela: Secondary | ICD-10-CM

## 2022-08-12 DIAGNOSIS — N138 Other obstructive and reflux uropathy: Secondary | ICD-10-CM

## 2022-08-12 DIAGNOSIS — N5082 Scrotal pain: Secondary | ICD-10-CM

## 2022-08-12 DIAGNOSIS — R319 Hematuria, unspecified: Secondary | ICD-10-CM

## 2023-06-24 ENCOUNTER — Emergency Department
Admission: EM | Admit: 2023-06-24 | Discharge: 2023-06-24 | Disposition: A | Discharge status: Home / Self Care | Payer: Self-pay | Attending: Emergency Medicine | Admitting: Emergency Medicine

## 2023-06-24 ENCOUNTER — Other Ambulatory Visit: Payer: Self-pay

## 2023-06-24 DIAGNOSIS — I10 Essential (primary) hypertension: Secondary | ICD-10-CM | POA: Insufficient documentation

## 2023-06-24 LAB — BASIC METABOLIC PANEL WITH GFR
Anion gap: 14 (ref 5–15)
BUN: UNDETERMINED mg/dL (ref 6–20)
CO2: 16 mmol/L — ABNORMAL LOW (ref 22–32)
Calcium: 8.6 mg/dL — ABNORMAL LOW (ref 8.9–10.3)
Chloride: 106 mmol/L (ref 98–111)
Creatinine, Ser: UNDETERMINED mg/dL (ref 0.61–1.24)
Glucose, Bld: 99 mg/dL (ref 70–99)
Potassium: 4.4 mmol/L (ref 3.5–5.1)
Sodium: 136 mmol/L (ref 135–145)

## 2023-06-24 LAB — CBC WITH DIFFERENTIAL/PLATELET
Abs Immature Granulocytes: 0.02 10*3/uL (ref 0.00–0.07)
Basophils Absolute: 0.1 10*3/uL (ref 0.0–0.1)
Basophils Relative: 1 %
Eosinophils Absolute: 0.4 10*3/uL (ref 0.0–0.5)
Eosinophils Relative: 5 %
HCT: 46.1 % (ref 39.0–52.0)
Hemoglobin: 15.6 g/dL (ref 13.0–17.0)
Immature Granulocytes: 0 %
Lymphocytes Relative: 33 %
Lymphs Abs: 2.8 10*3/uL (ref 0.7–4.0)
MCH: 31.6 pg (ref 26.0–34.0)
MCHC: 33.8 g/dL (ref 30.0–36.0)
MCV: 93.3 fL (ref 80.0–100.0)
Monocytes Absolute: 0.7 10*3/uL (ref 0.1–1.0)
Monocytes Relative: 9 %
Neutro Abs: 4.4 10*3/uL (ref 1.7–7.7)
Neutrophils Relative %: 52 %
Platelets: 249 10*3/uL (ref 150–400)
RBC: 4.94 MIL/uL (ref 4.22–5.81)
RDW: 12.2 % (ref 11.5–15.5)
WBC: 8.4 10*3/uL (ref 4.0–10.5)
nRBC: 0 % (ref 0.0–0.2)

## 2023-06-24 LAB — CREATININE, SERUM
Creatinine, Ser: 0.84 mg/dL (ref 0.61–1.24)
GFR, Estimated: >60 mL/min (ref >60)

## 2023-06-24 LAB — BUN: BUN: 13 mg/dL (ref 6–20)

## 2023-06-24 MED ORDER — LISINOPRIL 10 MG PO TABS
20.0000 mg | ORAL_TABLET | Freq: Once | ORAL | Status: AC
  Administered 2023-06-24: 20 mg via ORAL
  Filled 2023-06-24: qty 2

## 2023-06-24 MED ORDER — METOPROLOL SUCCINATE ER 100 MG PO TB24
100.0000 mg | ORAL_TABLET | Freq: Every day | ORAL | 2 refills | Status: AC
Start: 2023-06-24 — End: 2023-09-29
  Filled 2023-06-24: qty 30, 30d supply, fill #0
  Filled 2023-07-24: qty 30, 30d supply, fill #1
  Filled 2023-08-30: qty 30, 30d supply, fill #2

## 2023-06-24 MED ORDER — LOSARTAN POTASSIUM 50 MG PO TABS
50.0000 mg | ORAL_TABLET | Freq: Every day | ORAL | 2 refills | Status: AC
Start: 2023-06-24 — End: 2023-10-25
  Filled 2023-06-24: qty 30, 30d supply, fill #0
  Filled 2023-07-24: qty 30, 30d supply, fill #1
  Filled 2023-09-25: qty 30, 30d supply, fill #2

## 2023-06-24 NOTE — ED Triage Notes (Signed)
 Pt sent from doctors office for hypertension. Pt states he is feeling anxious and stiffness in his neck, denies headache, blurred vision, some dizziness. Pt went to [place] recently to have his prescription filled due to insurance issues here.

## 2023-06-24 NOTE — ED Provider Notes (Signed)
 [facility] Provider Note    Event Date/Time   First MD Initiated Contact with Patient 06/24/23 1215     (approximate)   History   Chief Complaint Hypertension   HPI  Thomas Gaines is a 58 y.o. male with past medical history of hypertension and hyperlipidemia who presents to the ED complaining of hypertension.  History is limited as patient is Spanish-speaking only, history obtained via interpreter [phone].  Patient reports that he ran out of his blood pressure medication about a month ago, states he has been feeling unwell since then.  He describes feeling anxious and restless with some stiffness in the back of his head and neck.  He has not had any chest pain or shortness of breath, denies vision changes, speech changes, numbness, or weakness.  He states that he went to [place] to get refill on his medications, but has felt worse since taking these medications from [place].  He went to follow-up at the walk-in clinic today, was noted to have elevated blood pressure and sent to the ED for evaluation.      Physical Exam   Triage Vital Signs: ED Triage Vitals  Encounter Vitals Group     BP 06/24/23 1213 (!) 185/102     Systolic BP Percentile --      Diastolic BP Percentile --      Pulse Rate 06/24/23 1213 75     Resp 06/24/23 1213 18     Temp 06/24/23 1213 98.5 F (36.9 C)     Temp Source 06/24/23 1213 Oral     SpO2 06/24/23 1213 100 %     Weight --      Height --      Head Circumference --      Peak Flow --      Pain Score 06/24/23 1209 0     Pain Loc --      Pain Education --      Exclude from Growth Chart --     Most recent vital signs: Vitals:   06/24/23 1230 06/24/23 1330  BP: (!) 172/98 (!) 156/96  Pulse: 82 72  Resp: 12 15  Temp:    SpO2: 100% 100%    Constitutional: Alert and oriented. Eyes: Conjunctivae are normal. Head: Atraumatic. Nose: No congestion/rhinnorhea. Mouth/Throat: Mucous membranes are moist.  Neck:  Supple with no meningismus. Cardiovascular: Normal rate, regular rhythm. Grossly normal heart sounds.  2+ radial pulses bilaterally. Respiratory: Normal respiratory effort.  No retractions. Lungs CTAB. Gastrointestinal: Soft and nontender. No distention. Musculoskeletal: No lower extremity tenderness nor edema.  Neurologic:  Normal speech and language. No gross focal neurologic deficits are appreciated.    ED Results / Procedures / Treatments   Labs (all labs ordered are listed, but only abnormal results are displayed) Labs Reviewed  BASIC METABOLIC PANEL WITH GFR - Abnormal; Notable for the following components:      Result Value   CO2 16 (*)    Calcium 8.6 (*)    All other components within normal limits  CBC WITH DIFFERENTIAL/PLATELET  BUN  CREATININE, SERUM  CBC WITH DIFFERENTIAL/PLATELET     EKG  ED ECG REPORT I, Twilla Galea, the attending physician, personally viewed and interpreted this ECG.   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                              58 y.o. male with past medical history of hypertension and hyperlipidemia who presents to the ED complaining of feeling generally unwell with anxiousness, restlessness, and stiffness in the back of his head and neck since running out of his blood pressure medications.  Patient's presentation is most consistent with acute presentation with potential threat to life or bodily function.  Differential diagnosis includes, but is not limited to, stroke, TIA, hypertensive emergency, ACS,  arrhythmia, anemia, electrolyte abnormality, AKI.  Patient nontoxic-appearing and in no acute distress, vital signs remarkable for elevated blood pressure but otherwise reassuring.  He has no focal neurologic deficits on exam, with his gradually worsening headache I doubt SAH and no findings concerning for meningitis.  Symptoms do not appear concerning for stroke and I do not feel CT imaging of his head is indicated at this time.  We will screen EKG and labs, give his usual dose of lisinopril  and reassess.  EKG shows no evidence of arrhythmia or ischemia, labs without significant anemia, leukocytosis, electrolyte abnormality, or AKI.  BP improving after dose of lisinopril .  We will restart patient on his home medications and he was counseled to follow-up with his PCP for recheck of BP.  He was counseled to return to the ED for new or worsening symptoms, patient agrees with plan.      FINAL CLINICAL IMPRESSION(S) / ED DIAGNOSES   Final diagnoses:  Uncontrolled hypertension     Rx / DC Orders   ED Discharge Orders          Ordered    losartan (COZAAR) 50 MG tablet  Daily        06/24/23 1400    metoprolol  succinate (TOPROL  XL) 100 MG 24 hr tablet  Daily        06/24/23 1400             Note:  This document was prepared using Dragon voice recognition software and may include unintentional dictation errors.   Twilla Galea, MD 06/24/23 [phone]

## 2023-06-25 ENCOUNTER — Other Ambulatory Visit: Payer: Self-pay

## 2023-06-26 ENCOUNTER — Other Ambulatory Visit: Payer: Self-pay

## 2023-06-29 ENCOUNTER — Other Ambulatory Visit: Payer: Self-pay

## 2023-06-29 MED ORDER — METOPROLOL SUCCINATE ER 100 MG PO TB24
100.0000 mg | ORAL_TABLET | Freq: Every day | ORAL | 1 refills | Status: AC
  Filled 2023-10-05: qty 90, 90d supply, fill #0

## 2023-06-29 MED ORDER — FLUTICASONE PROPIONATE 50 MCG/ACT NA SUSP
2.0000 | Freq: Every day | NASAL | 5 refills | Status: AC
Start: 2023-06-29 — End: ?
  Filled 2023-06-29: qty 16, 30d supply, fill #0

## 2023-06-29 MED ORDER — LOSARTAN POTASSIUM 50 MG PO TABS
50.0000 mg | ORAL_TABLET | Freq: Every day | ORAL | 1 refills | Status: AC
  Filled 2023-06-29 – 2023-10-25 (×2): qty 90, 90d supply, fill #0

## 2023-06-29 MED ORDER — PREDNISONE 10 MG PO TABS
ORAL_TABLET | ORAL | 0 refills | Status: AC
  Filled 2023-06-29: qty 12, 6d supply, fill #0

## 2023-06-29 MED ORDER — AZELASTINE HCL 137 MCG/SPRAY NA SOLN
1.0000 | Freq: Two times a day (BID) | NASAL | 5 refills | Status: AC
  Filled 2023-06-29: qty 30, 30d supply, fill #0

## 2023-06-29 MED ORDER — PRAMIPEXOLE DIHYDROCHLORIDE 0.25 MG PO TABS
0.2500 mg | ORAL_TABLET | Freq: Every day | ORAL | 1 refills | Status: DC
  Filled 2023-06-29: qty 90, 90d supply, fill #0
  Filled 2023-10-16: qty 90, 90d supply, fill #1

## 2023-06-29 MED ORDER — GABAPENTIN 300 MG PO CAPS
900.0000 mg | ORAL_CAPSULE | Freq: Every day | ORAL | 1 refills | Status: DC
  Filled 2023-06-29: qty 270, 90d supply, fill #0
  Filled 2023-10-16 (×3): qty 270, 90d supply, fill #1

## 2023-07-04 ENCOUNTER — Other Ambulatory Visit: Payer: Self-pay

## 2023-07-24 ENCOUNTER — Other Ambulatory Visit: Payer: Self-pay

## 2023-08-16 ENCOUNTER — Other Ambulatory Visit: Payer: Self-pay

## 2023-08-16 MED ORDER — SIMVASTATIN 20 MG PO TABS
20.0000 mg | ORAL_TABLET | Freq: Every day | ORAL | 1 refills | Status: DC
  Filled 2023-08-16: qty 90, 90d supply, fill #0
  Filled 2023-11-21: qty 90, 90d supply, fill #1

## 2023-08-30 ENCOUNTER — Other Ambulatory Visit: Payer: Self-pay

## 2023-09-25 ENCOUNTER — Other Ambulatory Visit: Payer: Self-pay

## 2023-10-05 ENCOUNTER — Other Ambulatory Visit: Payer: Self-pay

## 2023-10-16 ENCOUNTER — Other Ambulatory Visit: Payer: Self-pay

## 2023-10-25 ENCOUNTER — Other Ambulatory Visit: Payer: Self-pay

## 2023-11-21 ENCOUNTER — Other Ambulatory Visit: Payer: Self-pay

## 2024-01-09 ENCOUNTER — Other Ambulatory Visit: Payer: Self-pay

## 2024-01-09 MED ORDER — SIMVASTATIN 20 MG PO TABS
20.0000 mg | ORAL_TABLET | Freq: Every day | ORAL | 1 refills | Status: AC
  Filled 2024-01-09: qty 90, 90d supply, fill #0

## 2024-01-09 MED ORDER — GABAPENTIN 300 MG PO CAPS
900.0000 mg | ORAL_CAPSULE | Freq: Every day | ORAL | 1 refills | Status: AC
  Filled 2024-01-09 (×3): qty 270, 90d supply, fill #0

## 2024-01-09 MED ORDER — LOSARTAN POTASSIUM 50 MG PO TABS
50.0000 mg | ORAL_TABLET | Freq: Every day | ORAL | 1 refills | Status: AC
  Filled 2024-01-09: qty 90, 90d supply, fill #0

## 2024-01-09 MED ORDER — PRAMIPEXOLE DIHYDROCHLORIDE 0.25 MG PO TABS
0.2500 mg | ORAL_TABLET | Freq: Every day | ORAL | 1 refills | Status: AC
  Filled 2024-01-09: qty 90, 90d supply, fill #0

## 2024-01-09 MED ORDER — METOPROLOL SUCCINATE ER 100 MG PO TB24
100.0000 mg | ORAL_TABLET | Freq: Every day | ORAL | 1 refills | Status: AC
  Filled 2024-01-09: qty 90, 90d supply, fill #0
